# Patient Record
Sex: Male | Born: 1975 | Race: White | Hispanic: No | Marital: Married | Smoking: Never smoker
Health system: Southern US, Community
[De-identification: ages and names within clinical notes are randomized; demographics above are authoritative.]

## PROBLEM LIST (undated history)

## (undated) ENCOUNTER — Ambulatory Visit (HOSPITAL_COMMUNITY): Payer: Self-pay

## (undated) DIAGNOSIS — I4891 Unspecified atrial fibrillation: Secondary | ICD-10-CM

## (undated) DIAGNOSIS — L709 Acne, unspecified: Secondary | ICD-10-CM

## (undated) DIAGNOSIS — J45909 Unspecified asthma, uncomplicated: Secondary | ICD-10-CM

## (undated) HISTORY — DX: Unspecified asthma, uncomplicated: J45.909

## (undated) HISTORY — PX: CARDIOVERSION: SHX1299

## (undated) HISTORY — PX: FOOT SURGERY: SHX648

## (undated) HISTORY — DX: Acne, unspecified: L70.9

---

## 1998-04-11 ENCOUNTER — Emergency Department (HOSPITAL_COMMUNITY): Admission: EM | Admit: 1998-04-11 | Discharge: 1998-04-11 | Payer: Self-pay | Admitting: Emergency Medicine

## 1998-09-23 ENCOUNTER — Emergency Department (HOSPITAL_COMMUNITY): Admission: EM | Admit: 1998-09-23 | Discharge: 1998-09-23 | Payer: Self-pay | Admitting: Emergency Medicine

## 1998-09-27 ENCOUNTER — Emergency Department (HOSPITAL_COMMUNITY): Admission: EM | Admit: 1998-09-27 | Discharge: 1998-09-27 | Payer: Self-pay | Admitting: Emergency Medicine

## 2000-06-26 ENCOUNTER — Emergency Department (HOSPITAL_COMMUNITY): Admission: EM | Admit: 2000-06-26 | Discharge: 2000-06-26 | Payer: Self-pay | Admitting: Emergency Medicine

## 2001-07-14 ENCOUNTER — Encounter: Payer: Self-pay | Admitting: *Deleted

## 2001-07-14 ENCOUNTER — Emergency Department (HOSPITAL_COMMUNITY): Admission: AC | Admit: 2001-07-14 | Discharge: 2001-07-14 | Payer: Self-pay

## 2001-07-15 ENCOUNTER — Emergency Department (HOSPITAL_COMMUNITY): Admission: EM | Admit: 2001-07-15 | Discharge: 2001-07-15 | Payer: Self-pay | Admitting: *Deleted

## 2001-07-17 ENCOUNTER — Emergency Department (HOSPITAL_COMMUNITY): Admission: EM | Admit: 2001-07-17 | Discharge: 2001-07-17 | Payer: Self-pay | Admitting: Emergency Medicine

## 2001-07-17 ENCOUNTER — Encounter: Payer: Self-pay | Admitting: Emergency Medicine

## 2004-04-05 ENCOUNTER — Emergency Department (HOSPITAL_COMMUNITY): Admission: EM | Admit: 2004-04-05 | Discharge: 2004-04-05 | Payer: Self-pay | Admitting: Emergency Medicine

## 2004-11-29 ENCOUNTER — Emergency Department (HOSPITAL_COMMUNITY): Admission: EM | Admit: 2004-11-29 | Discharge: 2004-11-29 | Payer: Self-pay | Admitting: Family Medicine

## 2005-07-27 ENCOUNTER — Emergency Department (HOSPITAL_COMMUNITY): Admission: EM | Admit: 2005-07-27 | Discharge: 2005-07-28 | Payer: Self-pay | Admitting: Emergency Medicine

## 2006-07-13 ENCOUNTER — Encounter: Admission: RE | Admit: 2006-07-13 | Discharge: 2006-07-13 | Payer: Self-pay | Admitting: Sports Medicine

## 2006-07-13 ENCOUNTER — Ambulatory Visit: Payer: Self-pay | Admitting: Family Medicine

## 2006-08-02 ENCOUNTER — Ambulatory Visit: Payer: Self-pay | Admitting: Sports Medicine

## 2006-10-13 ENCOUNTER — Ambulatory Visit: Payer: Self-pay | Admitting: Family Medicine

## 2006-11-10 DIAGNOSIS — L708 Other acne: Secondary | ICD-10-CM | POA: Insufficient documentation

## 2007-04-18 ENCOUNTER — Encounter: Payer: Self-pay | Admitting: Family Medicine

## 2007-04-19 ENCOUNTER — Telehealth (INDEPENDENT_AMBULATORY_CARE_PROVIDER_SITE_OTHER): Payer: Self-pay | Admitting: Family Medicine

## 2007-05-11 ENCOUNTER — Ambulatory Visit: Payer: Self-pay | Admitting: Family Medicine

## 2007-05-11 ENCOUNTER — Telehealth (INDEPENDENT_AMBULATORY_CARE_PROVIDER_SITE_OTHER): Payer: Self-pay | Admitting: *Deleted

## 2007-05-11 DIAGNOSIS — R030 Elevated blood-pressure reading, without diagnosis of hypertension: Secondary | ICD-10-CM | POA: Insufficient documentation

## 2007-05-11 DIAGNOSIS — M5440 Lumbago with sciatica, unspecified side: Secondary | ICD-10-CM | POA: Insufficient documentation

## 2007-05-17 ENCOUNTER — Encounter (INDEPENDENT_AMBULATORY_CARE_PROVIDER_SITE_OTHER): Payer: Self-pay | Admitting: Family Medicine

## 2007-05-17 ENCOUNTER — Ambulatory Visit: Payer: Self-pay | Admitting: Family Medicine

## 2007-12-18 ENCOUNTER — Telehealth: Payer: Self-pay | Admitting: *Deleted

## 2010-07-29 ENCOUNTER — Encounter: Payer: Self-pay | Admitting: Family Medicine

## 2010-10-13 NOTE — Miscellaneous (Signed)
   Clinical Lists Changes  Problems: Removed problem of ASTHMA, UNSPECIFIED (ICD-493.90) 

## 2010-11-01 ENCOUNTER — Encounter: Payer: Self-pay | Admitting: *Deleted
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
## 2012-05-14 ENCOUNTER — Emergency Department (HOSPITAL_COMMUNITY)
Admission: EM | Admit: 2012-05-14 | Discharge: 2012-05-14 | Disposition: A | Discharge status: Home / Self Care | Payer: BC Managed Care – PPO | Attending: Emergency Medicine | Admitting: Emergency Medicine

## 2012-05-14 ENCOUNTER — Encounter (HOSPITAL_COMMUNITY): Payer: Self-pay | Admitting: Emergency Medicine

## 2012-05-14 DIAGNOSIS — M545 Low back pain, unspecified: Secondary | ICD-10-CM | POA: Insufficient documentation

## 2012-05-14 LAB — URINE MICROSCOPIC-ADD ON

## 2012-05-14 LAB — CBC WITH DIFFERENTIAL/PLATELET
Basophils Absolute: 0 10*3/uL (ref 0.0–0.1)
Basophils Relative: 1 % (ref 0–1)
Eosinophils Absolute: 0.1 10*3/uL (ref 0.0–0.7)
Eosinophils Relative: 2 % (ref 0–5)
HCT: 44.1 % (ref 39.0–52.0)
Hemoglobin: 15.6 g/dL (ref 13.0–17.0)
Lymphocytes Relative: 33 % (ref 12–46)
MCHC: 35.4 g/dL (ref 30.0–36.0)
Monocytes Relative: 7 % (ref 3–12)
Neutrophils Relative %: 58 % (ref 43–77)
WBC: 6.1 10*3/uL (ref 4.0–10.5)

## 2012-05-14 LAB — URINALYSIS, ROUTINE W REFLEX MICROSCOPIC
Ketones, ur: NEGATIVE mg/dL
Leukocytes, UA: NEGATIVE
Nitrite: NEGATIVE
Specific Gravity, Urine: 1.011 (ref 1.005–1.030)

## 2012-05-14 LAB — BASIC METABOLIC PANEL
CO2: 31 mEq/L (ref 19–32)
Calcium: 9.9 mg/dL (ref 8.4–10.5)
GFR calc Af Amer: >90 mL/min (ref >90)
Potassium: 4.6 mEq/L (ref 3.5–5.1)

## 2012-05-14 MED ORDER — OXYCODONE-ACETAMINOPHEN 5-325 MG PO TABS: ORAL_TABLET | ORAL | Status: AC

## 2012-05-14 NOTE — ED Notes (Signed)
Pt presents w/ low back pain x 9 days. Denies injury of any kind. Denies nausea, dysuria, states urine does have an odor at time.s

## 2012-05-14 NOTE — ED Provider Notes (Signed)
History     CSN: [id number]  Arrival date & time 05/14/12  4098   First MD Initiated Contact with Patient 05/14/12 1029      Chief Complaint  Patient presents with  . Back Pain    (Consider location/radiation/quality/duration/timing/severity/associated sxs/prior treatment) HPI  36 y.o. male iINAD c/o pressure like, 9/10 central lumbar sacral back pain, x9 days. Pian level has been constant, mild relief with ibuprofen, exacerbated by movement and weight bearing. Denies any trauma or prior episodes of back pain. Pt does lifting and construction for work x7 days. and Denies fever, N/V, change in bowel habits, h/o CA or IVDU. Pt reports urine has been dark and foul smelling x2 days.   History reviewed. No pertinent past medical history.  History reviewed. No pertinent past surgical history.  No family history on file.  History  Substance Use Topics  . Smoking status: Never Smoker   . Smokeless tobacco: Never Used  . Alcohol Use: No      Review of Systems  Constitutional: Negative for fever.  HENT: Negative for neck pain.   Respiratory: Negative for shortness of breath.   Cardiovascular: Negative for chest pain.  Gastrointestinal: Negative for nausea, vomiting, abdominal pain and diarrhea.  Genitourinary: Negative for dysuria and difficulty urinating.  Musculoskeletal: Positive for back pain.  Neurological: Negative for weakness and numbness.  All other systems reviewed and are negative.    Allergies  Review of patient's allergies indicates no known allergies.  Home Medications   Current Outpatient Rx  Name Route Sig Dispense Refill  . IBUPROFEN 200 MG PO TABS Oral Take 400 mg by mouth every 8 (eight) hours as needed. For pain.      BP 122/79  Pulse 93  Temp 98.3 F (36.8 C) (Oral)  SpO2 100%  Physical Exam  Nursing note and vitals reviewed. Constitutional: He is oriented to person, place, and time. He appears well-developed and well-nourished. No distress.    HENT:  Head: Normocephalic.  Eyes: Conjunctivae and EOM are normal. Pupils are equal, round, and reactive to light.  Neck: Normal range of motion.  Cardiovascular: Normal rate and regular rhythm.   Pulmonary/Chest: Effort normal.  Abdominal: Soft. Bowel sounds are normal.  Musculoskeletal: Normal range of motion.       Decreased range of motion in flexion and lateral extension. No CVA tenderness bilaterally.  Neurological: He is alert and oriented to person, place, and time.       Cranial nerves III through XII intact, strength 5 out of 5x4 extremities, negative pronator drift, finger to nose and heel-to-shin coordinated, sensation intact to pinprick and light touch, gait is coordinated and Romberg is negative.   Psychiatric: He has a normal mood and affect.    ED Course  Procedures (including critical care time)  Labs Reviewed  URINALYSIS, ROUTINE W REFLEX MICROSCOPIC - Abnormal; Notable for the following:    Hgb urine dipstick TRACE (*)     All other components within normal limits  BASIC METABOLIC PANEL - Abnormal; Notable for the following:    Glucose, Bld 102 (*)     GFR calc non Af Amer 84 (*)     All other components within normal limits  CBC WITH DIFFERENTIAL  URINE MICROSCOPIC-ADD ON   No results found.   1. Lumbago       MDM  Low back pain with no red flags. Over for her to orthopedist control pain with prescription for Percocet. He has a trace amount of blood  in his urine I will refer him to a primary care for recheck of urinalysis in several months.  New Prescriptions   OXYCODONE-ACETAMINOPHEN (PERCOCET/ROXICET) 5-325 MG PER TABLET    1 to 2 tabs PO q6hrs  PRN for pain          Wynetta Emery, PA-C 05/14/12 1236

## 2012-05-19 NOTE — ED Provider Notes (Signed)
Medical screening examination/treatment/procedure(s) were performed by non-physician practitioner and as supervising physician I was immediately available for consultation/collaboration.   Loren Racer, MD 05/19/12 [phone]

## 2012-10-19 ENCOUNTER — Ambulatory Visit (INDEPENDENT_AMBULATORY_CARE_PROVIDER_SITE_OTHER): Payer: Self-pay | Admitting: Family Medicine

## 2012-10-19 ENCOUNTER — Encounter: Payer: Self-pay | Admitting: Family Medicine

## 2012-10-19 VITALS — BP 151/89 | HR 83 | Ht 74.0 in | Wt 191.0 lb

## 2012-10-19 DIAGNOSIS — D229 Melanocytic nevi, unspecified: Secondary | ICD-10-CM

## 2012-10-19 DIAGNOSIS — L918 Other hypertrophic disorders of the skin: Secondary | ICD-10-CM | POA: Insufficient documentation

## 2012-10-19 DIAGNOSIS — D239 Other benign neoplasm of skin, unspecified: Secondary | ICD-10-CM

## 2012-10-19 DIAGNOSIS — R030 Elevated blood-pressure reading, without diagnosis of hypertension: Secondary | ICD-10-CM

## 2012-10-19 DIAGNOSIS — L708 Other acne: Secondary | ICD-10-CM

## 2012-10-19 MED ORDER — TETRACYCLINE HCL 250 MG PO CAPS: 250.0000 mg | ORAL_CAPSULE | Freq: Two times a day (BID) | ORAL | Status: DC

## 2012-10-19 NOTE — Progress Notes (Signed)
Subjective:     Patient ID: Juan Rogers, male   DOB: 1976/03/21, 37 y.o.   MRN: [id number]  HPI Juan Rogers is a 37 year old male who presents to the clinic today to establish care.  Patient would like to discuss acne and mole removal.  1) Acne - Patient has long standing history of acne (since he was 37 years old).  Patient has had significant scarring as a result. - Patient has been on Tetracycline before with good results and prevention of breakouts and subsequent scarring. - Reports occasional outbreaks primarily on his face and back.  2) Moles - Patient reports bothersome moles on the back.  Patient also reports mole on his belt/waistline. - He reports that the moles on his back itch and burn, and the mole on his waistline constantly catches on his clothing.  They are very bothersome and irritating. - He and his spouse deny any change in shape, or size.  Some subjective color change noted by spouse.  Review of Systems Denies any recent illness, fevers, chills.  No other complaints today.    Objective:   Physical Exam General: well appearing gentleman in NAD. Heart: RRR. No murmurs, rubs, or gallops. Lungs: CTAB. No rales, rhonchi, or wheeze. Skin: marked facial scarring noted.  3 flesh colored, pedunculated nevi noted on the back (L scapular, mid thoracic and R scapular regions).  Well circumscribed, symmetrical, with distinct borders.  No appreciable color changes noted.   1 nevi noted at the waist line.  Appears slightly darker than flesh.  Symmetrical, distinct borders.    Assessment:         Plan:

## 2012-10-19 NOTE — Assessment & Plan Note (Signed)
Patient instructed to follow up in 1-2 weeks for beginning of mole removal.

## 2012-10-19 NOTE — Assessment & Plan Note (Signed)
Discussed topical vs systemic therapy and side effects of medications. Patient would like to resume Tetracycline.  Rx sent for Tetracycline 250 mg BID.

## 2012-10-19 NOTE — Patient Instructions (Addendum)
It was nice meeting you today.  I have sent in your prescription for Tetracycline.  Please return in 2 weeks for mole removal.

## 2012-10-19 NOTE — Assessment & Plan Note (Signed)
Patient likely has HTN.  Will recheck BP next visit.  If >140/90 will start treatment.

## 2012-11-06 ENCOUNTER — Ambulatory Visit (INDEPENDENT_AMBULATORY_CARE_PROVIDER_SITE_OTHER): Payer: Self-pay | Admitting: Family Medicine

## 2012-11-06 VITALS — BP 124/84 | HR 76 | Temp 99.2°F | Ht 74.0 in | Wt 189.0 lb

## 2012-11-06 DIAGNOSIS — L919 Hypertrophic disorder of the skin, unspecified: Secondary | ICD-10-CM

## 2012-11-06 DIAGNOSIS — L918 Other hypertrophic disorders of the skin: Secondary | ICD-10-CM

## 2012-11-06 DIAGNOSIS — L909 Atrophic disorder of skin, unspecified: Secondary | ICD-10-CM

## 2012-11-06 NOTE — Assessment & Plan Note (Signed)
4 skin tags removed.  Aftercare instructions verbally given.

## 2012-11-06 NOTE — Progress Notes (Signed)
Subjective:     Patient ID: Juan Rogers, male   DOB: 06-02-1976, 37 y.o.   MRN: [id number]  HPI 37 year old gentleman presents today for mole/skin tag removal.  1) Skin tags - Patient reports 4 skin lesions that he wants removed.  He reports that the  "moles" on his back itch and burn, and the "mole" on his waistline constantly catches on his clothing.  - They are very bothersome and irritating.  They are irritated by clothing and physical contact.  Review of Systems See HPI    Objective:   Physical Exam General: well appearing. NAD. Skin: 4 skin tags noted.  3 posterior on the back (located the the thoracic region and the R & L scapular regons).  1 located centrally below the umbilicus at the area of the waistline.    Assessment:     1) Skin tags     Plan:     Skin tags and surrounding areas were cleansed with alcohol. Topical cold analgesic spray was used for the skin tags on the back.  Local anesthesia with 2% lidocaine (1 mL) was used in addition to analgesic spray for the skin tag on the waistline. Skin tags were subsequently removed using a curved hemostat and sterile scissors.  Silver nitrate stick was used to cauterize the area after removing waistline skin tag.  The remaining skin tags did not require cauterization.  Bandaids were placed following excision.  Blood loss - minimal.  Patient tolerated procedure well.  These pathognomonic lesions are not sent for pathology.

## 2012-11-09 ENCOUNTER — Encounter: Payer: Self-pay | Admitting: Family Medicine

## 2012-11-09 ENCOUNTER — Ambulatory Visit (INDEPENDENT_AMBULATORY_CARE_PROVIDER_SITE_OTHER): Payer: Self-pay | Admitting: Family Medicine

## 2012-11-09 ENCOUNTER — Telehealth: Payer: Self-pay | Admitting: Family Medicine

## 2012-11-09 VITALS — BP 129/85 | HR 73 | Ht 74.0 in | Wt 189.0 lb

## 2012-11-09 DIAGNOSIS — L089 Local infection of the skin and subcutaneous tissue, unspecified: Secondary | ICD-10-CM

## 2012-11-09 MED ORDER — CEPHALEXIN 500 MG PO CAPS: 500.0000 mg | ORAL_CAPSULE | Freq: Three times a day (TID) | ORAL | Status: AC

## 2012-11-09 NOTE — Patient Instructions (Addendum)
Thank you for coming in today, it was good to see you The area around your waist is infected Complete the course of keflex.  Return for worsening symptoms.

## 2012-11-09 NOTE — Telephone Encounter (Signed)
Had moles removed on Monday and he thinks that they are infected today - wants to know if he should come in.  Dr Adriana Simas has avail appt tomorrow, but he wants to talk to nurse first

## 2012-11-09 NOTE — Telephone Encounter (Signed)
Advised patient to come to office now for work in appointment. Patient states area on waist in front is red quarter size and has drainage.

## 2012-11-13 DIAGNOSIS — L089 Local infection of the skin and subcutaneous tissue, unspecified: Secondary | ICD-10-CM | POA: Insufficient documentation

## 2012-11-13 NOTE — Assessment & Plan Note (Signed)
Small amount of purulent draining and erythema.  Will start 10 day course of keflex.  Instructed to return for worsening symptoms.  Irritation from waist band of pants likely irritating as well, advised keeping covered.

## 2012-11-13 NOTE — Progress Notes (Signed)
  Subjective:    Patient ID: Juan Rogers, Juan Rogers    DOB: 1976-03-19, 37 y.o.   MRN: [id number]  HPI  1. ? Skin infection:  C/o drainage for site where skin tag was removed on 2/24.  Has noticed pain, redness and yellow/white drainage from area along waist line where skin tag was removed.  Was given rx for tetracycline for acne earlier in month as well but has not started.  He denies fever,chills, pain or drainage from other sites of removal.   Review of Systems Per HPI    Objective:   Physical Exam  Constitutional: He appears well-nourished. No distress.  Skin:  Small erythematous area along anterior waist line from previous removal of skin tag.  There is purulent drainage.  No induration or fluctuance of area.            Assessment & Plan:

## 2013-03-21 ENCOUNTER — Other Ambulatory Visit: Payer: Self-pay | Admitting: Family Medicine

## 2013-07-20 ENCOUNTER — Ambulatory Visit: Payer: Self-pay | Admitting: Family Medicine

## 2013-11-19 ENCOUNTER — Other Ambulatory Visit: Payer: Self-pay | Admitting: *Deleted

## 2013-11-19 ENCOUNTER — Other Ambulatory Visit: Payer: Self-pay | Admitting: Family Medicine

## 2013-11-19 MED ORDER — TETRACYCLINE HCL 250 MG PO CAPS: ORAL_CAPSULE | ORAL | Status: DC

## 2013-11-21 ENCOUNTER — Other Ambulatory Visit: Payer: Self-pay | Admitting: Family Medicine

## 2013-11-21 MED ORDER — DOXYCYCLINE HYCLATE 150 MG PO TBEC: 150.0000 mg | DELAYED_RELEASE_TABLET | Freq: Every day | ORAL | Status: DC

## 2014-03-05 ENCOUNTER — Other Ambulatory Visit: Payer: Self-pay | Admitting: *Deleted

## 2014-03-06 MED ORDER — TETRACYCLINE HCL 250 MG PO CAPS: ORAL_CAPSULE | ORAL | Status: DC

## 2014-07-16 ENCOUNTER — Other Ambulatory Visit: Payer: Self-pay | Admitting: Family Medicine

## 2014-07-30 ENCOUNTER — Ambulatory Visit: Payer: Self-pay | Admitting: Family Medicine

## 2014-09-20 ENCOUNTER — Other Ambulatory Visit (INDEPENDENT_AMBULATORY_CARE_PROVIDER_SITE_OTHER): Payer: Self-pay | Admitting: Surgery

## 2014-09-20 NOTE — H&P (Signed)
Juan [facility] 09/20/2014 3:57 PM Location: [facility] Patient #: [id number] DOB: Aug 17, 1976 Married / Language: Juan [facility] / Race: White Male [place] All Reviewed History of Present Illness [name] MD; 09/20/2014 4:33 PM) Patient words: anal fistula.  The patient is a 39 year old male who presents with anal pain. Young male with 6 months of rectal pain and bleeding when he wipes for the past 6 months. Persistent. Wished to be seen. He normally has 1 or 2 bowel movements a day. 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 Does not recall a specific event that set things off. Occasionally the bowel movements can have sharp pain. Occasionally a few drops of blood. Usually only when he wipes. At one point he thought he might of felt a lump but not certain. No history of any gastrointestinal problems. No ulcerative colitis, Crohn's, irritable bowel syndrome. No family history of any such problems. No history of colon polyps or cancers in his family. He can walk a half hour easily without difficulty. No fevers chills or sweats. No purulent drainage. I saw him November 2015. Diagnosised posterior anal fissure. Recommended diltiazem cream. Came back 3 weeks later. Some improvement. I renewed the diltiazem cream. He had been using it 3 times a day. He thinks it helps a little. He felt like he wasn't helping much. He is gone back to using hydrocortisone cream. He is trying to void severe constipation or diarrhea. He is very concerned about needing to be off work to have surgery. Struggles with rectal pain. Still afraid to have BMs. Mild bleeding. Other Problems [name], MD; 09/20/2014 4:15 PM) ANAL FISSURE (565.0  Y70.6) Asthma Umbilical Hernia Repair  Diagnostic Studies History [name], MD; 09/20/2014 4:15 PM) Colonoscopy never  Allergies [name], MD; 09/20/2014 4:15 PM) No Known Drug Allergies 08/13/2014  Medication History [name],  MD; 09/20/2014 4:15 PM) DILTIAZEM Gel (2% Gel, see note application External three times daily, Taken starting 08/13/2014) Active. Tetracycline HCl (250MG  Capsule, Oral) Active.  Social History [name], MD; 09/20/2014 4:15 PM) Caffeine use Tea. No alcohol use No drug use Tobacco use Never smoker.  Family History [name], MD; 09/20/2014 4:15 PM) Cancer Family Members In General. Cervical Cancer Sister. Diabetes Mellitus Family Members In General. Heart Disease Family Members In General. Hypertension Family Members In General. Migraine Headache Brother, Mother.     Review of Systems [name] MD; 09/20/2014 4:15 PM) [place] as Reviewed General Not Present- Appetite Loss, Chills, Fatigue, Fever, Night Sweats, Weight Gain and Weight Loss. Skin Not Present- Change in Wart/Mole, Dryness, Hives, Jaundice, New Lesions, Non-Healing Wounds, Rash and Ulcer. HEENT Present- Seasonal Allergies. Not Present- Earache, Hearing Loss, Hoarseness, Nose Bleed, Oral Ulcers, Ringing in the Ears, Sinus Pain, Sore Throat, Visual Disturbances, Wears glasses/contact lenses and Yellow Eyes. Respiratory Not Present- Bloody sputum, Chronic Cough, Difficulty Breathing, Snoring and Wheezing. Breast Not Present- Breast Mass, Breast Pain, Nipple Discharge and Skin Changes. Cardiovascular Not Present- Chest Pain, Difficulty Breathing Lying Down, Leg Cramps, Palpitations, Rapid Heart Rate, Shortness of Breath and Swelling of Extremities. Gastrointestinal Present- Bloody Stool and Rectal Pain. Not Present- Abdominal Pain, Bloating, Change in Bowel Habits, Chronic diarrhea, Constipation, Difficulty Swallowing, Excessive gas, Gets full quickly at meals, Hemorrhoids, Indigestion, Nausea and Vomiting. Male Genitourinary Not Present- Blood in Urine, Change in Urinary Stream, Frequency, Impotence, Nocturia, Painful Urination, Urgency and Urine Leakage. Musculoskeletal Not Present- Back Pain, Joint Pain,  Joint Stiffness, Muscle Pain,  Muscle Weakness and Swelling of Extremities. Neurological Not Present- Decreased Memory, Fainting, Headaches, Numbness, Seizures, Tingling, Tremor, Trouble walking and Weakness. Psychiatric Not Present- Anxiety, Bipolar, Change in Sleep Pattern, Depression, Fearful and Frequent crying. Endocrine Not Present- Cold Intolerance, Excessive Hunger, Hair Changes, Heat Intolerance and New Diabetes. Hematology Not Present- Easy Bruising, Excessive bleeding, Gland problems, HIV and Persistent Infections.  Vitals [name] CMA; 09/20/2014 3:58 PM) 09/20/2014 3:58 PM Weight: 193.25 lb Height: 73in Body Surface Area: 2.12 m Body Mass Index: 25.5 kg/m Temp.: 97.27F  Pulse: 86 (Regular)  BP: 110/72 (Sitting, Left Arm, Standard)     Physical Exam [name] MD; 09/20/2014 4:33 PM)  General Mental Status-Alert. General Appearance-Not in acute distress, Not Sickly. Orientation-Oriented X3. Hydration-Well hydrated. Voice-Normal.  Integumentary Global Assessment Normal Exam - Axillae: non-tender, no inflammation or ulceration, no drainage. and Distribution of scalp and body hair is normal. General Characteristics Temperature - normal warmth is noted.  Head and Neck Head-normocephalic, atraumatic with no lesions or palpable masses. Face Global Assessment - atraumatic, no absence of expression. Neck Global Assessment - no abnormal movements, no bruit auscultated on the right, no bruit auscultated on the left, no decreased range of motion, non-tender. Trachea-midline. Thyroid Gland Characteristics - non-tender.  Eye Eyeball - Left-Extraocular movements intact, No Nystagmus. Eyeball - Right-Extraocular movements intact, No Nystagmus. Cornea - Left-No Hazy. Cornea - Right-No Hazy. Sclera/Conjunctiva - Left-No scleral icterus, No Discharge. Sclera/Conjunctiva - Right-No scleral icterus, No Discharge. Pupil -  Left-Direct reaction to light normal. Pupil - Right-Direct reaction to light normal.  ENMT Ears Pinna - Left - no drainage observed, no generalized tenderness observed. Right - no drainage observed, no generalized tenderness observed. Nose and Sinuses Nose - no destructive lesion observed. Nares - Left - quiet respiration. Right - quiet respiration. Mouth and Throat Lips - Upper Lip - no fissures observed, no pallor noted. Lower Lip - no fissures observed, no pallor noted. Nasopharynx - no discharge present. Oral Cavity/Oropharynx - Tongue - no dryness observed. Oral Mucosa - no cyanosis observed. Hypopharynx - no evidence of airway distress observed.  Chest and Lung Exam Inspection Movements - Normal and Symmetrical. Accessory muscles - No use of accessory muscles in breathing. Palpation Normal exam - Non-tender. Auscultation Breath sounds - Normal and Clear.  Cardiovascular Auscultation Rhythm - Regular. Murmurs & Other Heart Sounds - Normal exam - No Murmurs and No Systolic Clicks.  Abdomen Inspection Normal Exam - No Visible peristalsis and No Abnormal pulsations. Umbilicus - No Bleeding, No Urine drainage. Palpation/Percussion Normal exam - Soft, Non Tender, No Rebound tenderness, No Rigidity (guarding) and No Cutaneous hyperesthesia.  Male Genitourinary Sexual Maturity Tanner 5 - Adult hair pattern and Adult penile size and shape.  Rectal Anorectal Exam External - localized tenderness and anal fissure, no anorectal fistula, no external hemorrhoids, no lacerations. Note: Persistent posterior midline anal fissure. Not closed. Small sentiel tag. Increased sphincter tone.   Peripheral Vascular Upper Extremity Inspection - Left - No Cyanotic nailbeds, Not Ischemic. Right - No Cyanotic nailbeds, Not Ischemic.  Neurologic Neurologic evaluation reveals -normal attention span and ability to concentrate, able to name objects and repeat phrases. Appropriate fund of  knowledge , normal sensation and normal coordination. Mental Status Affect - not angry, not paranoid. Cranial Nerves-Normal Bilaterally. Gait-Normal.  Neuropsychiatric Mental status exam performed with findings of-able to articulate well with normal speech/language, rate, volume and coherence, thought content normal with ability to perform basic computations and apply abstract reasoning and no evidence of  hallucinations, delusions, obsessions or homicidal/suicidal ideation.  Musculoskeletal Global Assessment Spine, Ribs and Pelvis - no instability, subluxation or laxity. Right Upper Extremity - no instability, subluxation or laxity.  Lymphatic Head & Neck General Head & Neck Lymphatics: Bilateral - Description - No Localized lymphadenopathy. Axillary General Axillary Region: Bilateral - Description - No Localized lymphadenopathy. Femoral & Inguinal Generalized Femoral & Inguinal Lymphatics: Left: Right - Description - No Localized lymphadenopathy. Description - No Localized lymphadenopathy.    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 He has failed muscle relaxant therapy with diltiazem > a month. He no longer is using it. Plan EUA / lateral internal sphincterotomy:  The anatomy & physiology of the anorectal region was discussed. The pathophysiology of anal fissure and differential diagnosis was discussed. Natural history progression was discussed. I stressed the importance of a bowel regimen to have daily soft bowel movements to minimize progression of disease.  The patient's condition is not adequately controlled. Non-operative treatment has not healed the fissure. Therefore, I recommended examination under anesthesia for better examination to confirm the diagnosis and treat by lateral internal sphincterotomy to relax the spasm better & allow the fissure to heal. Technique,  benefits, alternatives were discussed. I noted a good likelihood this will help address the problem. Risks such as bleeding, pain, incontinence, recurrence, heart attack, death, and other risks were discussed.  Educational handouts further explaining the pathology, treatment options, and bowel regimen were given as well. The patient expressed understanding & wishes to proceed with surgery.  Current Plans Pt Education - [facility] Anal Fissure (Clyde Zarrella) Pt Education - [facility] Good Bowel Health (Sheilyn Boehlke) Pt Education - [facility] Rectal Surgery HCI (Alecxander Mainwaring): discussed with patient and provided information. Schedule for Surgery

## 2014-12-10 ENCOUNTER — Other Ambulatory Visit: Payer: Self-pay | Admitting: Family Medicine

## 2014-12-10 ENCOUNTER — Telehealth: Payer: Self-pay | Admitting: Family Medicine

## 2014-12-10 MED ORDER — TETRACYCLINE HCL 250 MG PO CAPS: 250.0000 mg | ORAL_CAPSULE | Freq: Two times a day (BID) | ORAL | Status: DC

## 2014-12-10 NOTE — Telephone Encounter (Signed)
I am covering Dr.Cook's inbox this week, received a request for abx for acne from this patient. He has not been seen at our clinic in 2 years and will need to make an appointment with his PCP for future refills. I have refilled his antibiotic for 1 month. Please make pt aware. Thanks.

## 2014-12-11 NOTE — Telephone Encounter (Signed)
LM for patient to call back and make an appt with his pcp. Jazmin Hartsell,CMA

## 2015-01-03 ENCOUNTER — Ambulatory Visit (INDEPENDENT_AMBULATORY_CARE_PROVIDER_SITE_OTHER): Payer: BLUE CROSS/BLUE SHIELD | Admitting: Family Medicine

## 2015-01-03 ENCOUNTER — Encounter: Payer: Self-pay | Admitting: Family Medicine

## 2015-01-03 VITALS — BP 146/73 | HR 70 | Temp 98.1°F | Ht 74.0 in | Wt 202.1 lb

## 2015-01-03 DIAGNOSIS — R030 Elevated blood-pressure reading, without diagnosis of hypertension: Secondary | ICD-10-CM

## 2015-01-03 DIAGNOSIS — L708 Other acne: Secondary | ICD-10-CM

## 2015-01-03 DIAGNOSIS — Z792 Long term (current) use of antibiotics: Secondary | ICD-10-CM | POA: Diagnosis not present

## 2015-01-03 LAB — CBC WITH DIFFERENTIAL/PLATELET
Basophils Absolute: 0.1 10*3/uL (ref 0.0–0.1)
Basophils Relative: 1 % (ref 0–1)
EOS ABS: 0.2 10*3/uL (ref 0.0–0.7)
EOS PCT: 3 % (ref 0–5)
HEMATOCRIT: 38.8 % — AB (ref 39.0–52.0)
HEMOGLOBIN: 14 g/dL (ref 13.0–17.0)
LYMPHS PCT: 40 % (ref 12–46)
Lymphs Abs: 2.7 10*3/uL (ref 0.7–4.0)
MCH: 31.5 pg (ref 26.0–34.0)
MCHC: 36.1 g/dL — AB (ref 30.0–36.0)
MCV: 87.4 fL (ref 78.0–100.0)
MONO ABS: 0.5 10*3/uL (ref 0.1–1.0)
MONOS PCT: 8 % (ref 3–12)
MPV: 9.9 fL (ref 8.6–12.4)
Neutro Abs: 3.3 10*3/uL (ref 1.7–7.7)
Neutrophils Relative %: 48 % (ref 43–77)
Platelets: 329 10*3/uL (ref 150–400)
RBC: 4.44 MIL/uL (ref 4.22–5.81)
RDW: 13.5 % (ref 11.5–15.5)
WBC: 6.8 10*3/uL (ref 4.0–10.5)

## 2015-01-03 NOTE — Patient Instructions (Signed)
It was nice to see you today.  Follow up annually.  I will send a letter with your lab results.  Take care  Dr. Lacinda Axon

## 2015-01-04 ENCOUNTER — Encounter: Payer: Self-pay | Admitting: Family Medicine

## 2015-01-04 LAB — COMPLETE METABOLIC PANEL WITH GFR
ALBUMIN: 4.1 g/dL (ref 3.5–5.2)
ALT: 17 U/L (ref 0–53)
AST: 20 U/L (ref 0–37)
Alkaline Phosphatase: 59 U/L (ref 39–117)
BUN: 17 mg/dL (ref 6–23)
CALCIUM: 9.6 mg/dL (ref 8.4–10.5)
CHLORIDE: 103 meq/L (ref 96–112)
CO2: 27 mEq/L (ref 19–32)
Creat: 1.22 mg/dL (ref 0.50–1.35)
GFR, Est African American: 88 mL/min
GFR, Est Non African American: 76 mL/min
GLUCOSE: 89 mg/dL (ref 70–99)
POTASSIUM: 4.2 meq/L (ref 3.5–5.3)
Sodium: 138 mEq/L (ref 135–145)
Total Bilirubin: 0.4 mg/dL (ref 0.2–1.2)
Total Protein: 6.7 g/dL (ref 6.0–8.3)

## 2015-01-04 NOTE — Assessment & Plan Note (Signed)
BP elevated today. Will re-evaluate at next visit. If elevated, will discuss treatment vs ambulatory BP monitoring.

## 2015-01-04 NOTE — Assessment & Plan Note (Signed)
Doing well.  Will refill tetracycline. CMP today to check LFT's.

## 2015-01-04 NOTE — Progress Notes (Signed)
   Subjective:    Patient ID: Juan [facility], male    DOB: 1976-02-22, 39 y.o.   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  1) Acne  Patient has a longstanding history of acne.  He has significant scarring.  He is currently taking Tetracycline for this and is doing quite well.  He has occasional outbreaks, but overall his acne is very well controlled.  No reports of photosensitivity, GI upset.  He has no other complaints today.  Review of Systems Per HPI    Objective:   Physical Exam Filed Vitals:   01/03/15 1609  BP: 146/73  Pulse: 70  Temp: 98.1 F (36.7 C)   Vital signs reviewed.  Exam: General: well appearing, NAD. Cardiovascular: RRR. No murmurs, rubs, or gallops. Respiratory: CTAB. No rales, rhonchi, or wheeze. Abdomen: soft, nontender, nondistended. No organomegaly. Skin: Warm, dry, intact.    Assessment & Plan:  See Problem List

## 2015-01-13 ENCOUNTER — Other Ambulatory Visit: Payer: Self-pay | Admitting: Family Medicine

## 2015-02-04 ENCOUNTER — Other Ambulatory Visit: Payer: Self-pay | Admitting: Family Medicine

## 2015-02-05 ENCOUNTER — Other Ambulatory Visit: Payer: Self-pay | Admitting: Family Medicine

## 2015-02-05 NOTE — Telephone Encounter (Signed)
Mr. Juan Rogers is calling because the refill for his tetracycline was received by the pharmacy, however, they were unable to retrieve it (possibly a "glitch"), they would like for it to be resent so that it may be refilled for the patient. Please contact Mr. Juan Rogers once this has been completed for him. Thank you, Fonda Kinder, ASA

## 2015-02-05 NOTE — Telephone Encounter (Signed)
Left voice message for informing him that Rx was called in to [facility].  Derl Barrow, RN

## 2015-06-17 ENCOUNTER — Other Ambulatory Visit: Payer: Self-pay | Admitting: *Deleted

## 2015-06-17 MED ORDER — TETRACYCLINE HCL 250 MG PO CAPS: 250.0000 mg | ORAL_CAPSULE | Freq: Two times a day (BID) | ORAL | Status: DC

## 2015-10-29 ENCOUNTER — Other Ambulatory Visit: Payer: Self-pay | Admitting: Family Medicine

## 2015-11-10 ENCOUNTER — Emergency Department (HOSPITAL_COMMUNITY)
Admission: EM | Admit: 2015-11-10 | Discharge: 2015-11-11 | Disposition: A | Discharge status: Home / Self Care | Payer: Managed Care, Other (non HMO) | Attending: Emergency Medicine | Admitting: Emergency Medicine

## 2015-11-10 ENCOUNTER — Encounter (HOSPITAL_COMMUNITY): Payer: Self-pay | Admitting: Emergency Medicine

## 2015-11-10 DIAGNOSIS — Z872 Personal history of diseases of the skin and subcutaneous tissue: Secondary | ICD-10-CM | POA: Diagnosis not present

## 2015-11-10 DIAGNOSIS — Z79899 Other long term (current) drug therapy: Secondary | ICD-10-CM | POA: Diagnosis not present

## 2015-11-10 DIAGNOSIS — R1011 Right upper quadrant pain: Secondary | ICD-10-CM | POA: Insufficient documentation

## 2015-11-10 DIAGNOSIS — J45909 Unspecified asthma, uncomplicated: Secondary | ICD-10-CM | POA: Insufficient documentation

## 2015-11-10 DIAGNOSIS — R197 Diarrhea, unspecified: Secondary | ICD-10-CM | POA: Insufficient documentation

## 2015-11-10 DIAGNOSIS — R3915 Urgency of urination: Secondary | ICD-10-CM | POA: Insufficient documentation

## 2015-11-10 DIAGNOSIS — R350 Frequency of micturition: Secondary | ICD-10-CM | POA: Insufficient documentation

## 2015-11-10 DIAGNOSIS — R109 Unspecified abdominal pain: Secondary | ICD-10-CM

## 2015-11-10 LAB — URINALYSIS, ROUTINE W REFLEX MICROSCOPIC
Bilirubin Urine: NEGATIVE
GLUCOSE, UA: NEGATIVE mg/dL
Ketones, ur: NEGATIVE mg/dL
Leukocytes, UA: NEGATIVE
Nitrite: NEGATIVE
PH: 5.5 (ref 5.0–8.0)
Protein, ur: NEGATIVE mg/dL
SPECIFIC GRAVITY, URINE: 1.02 (ref 1.005–1.030)

## 2015-11-10 LAB — COMPREHENSIVE METABOLIC PANEL
ALBUMIN: 4 g/dL (ref 3.5–5.0)
ALK PHOS: 51 U/L (ref 38–126)
ALT: 22 U/L (ref 17–63)
ANION GAP: 13 (ref 5–15)
AST: 23 U/L (ref 15–41)
BILIRUBIN TOTAL: 0.3 mg/dL (ref 0.3–1.2)
BUN: 12 mg/dL (ref 6–20)
CHLORIDE: 105 mmol/L (ref 101–111)
CO2: 23 mmol/L (ref 22–32)
Calcium: 9.5 mg/dL (ref 8.9–10.3)
Creatinine, Ser: 1.18 mg/dL (ref 0.61–1.24)
GFR calc Af Amer: >60 mL/min (ref >60)
GFR calc non Af Amer: >60 mL/min (ref >60)
Glucose, Bld: 127 mg/dL — ABNORMAL HIGH (ref 65–99)
POTASSIUM: 4.2 mmol/L (ref 3.5–5.1)
SODIUM: 141 mmol/L (ref 135–145)
Total Protein: 6.7 g/dL (ref 6.5–8.1)

## 2015-11-10 LAB — CBC
HEMATOCRIT: 40.8 % (ref 39.0–52.0)
HEMOGLOBIN: 14 g/dL (ref 13.0–17.0)
MCH: 30.7 pg (ref 26.0–34.0)
MCHC: 34.3 g/dL (ref 30.0–36.0)
MCV: 89.5 fL (ref 78.0–100.0)
Platelets: 337 10*3/uL (ref 150–400)
RBC: 4.56 MIL/uL (ref 4.22–5.81)
RDW: 12.4 % (ref 11.5–15.5)
WBC: 8.7 10*3/uL (ref 4.0–10.5)

## 2015-11-10 LAB — URINE MICROSCOPIC-ADD ON

## 2015-11-10 LAB — LIPASE, BLOOD: Lipase: 51 U/L (ref 11–51)

## 2015-11-10 NOTE — ED Notes (Signed)
Patient here with complaint of RLQ abdominal pain. States onset today. Denies nausea, vomiting, diarrhea, fever. States urinary urgency, but no need to void. Abdomen firm over RLQ and RUQ. Obviously uncomfortable in triage.

## 2015-11-10 NOTE — ED Provider Notes (Addendum)
CSN: [id number]     Arrival date & time 11/10/15  1934 History  By signing my name below, I, Helane Gunther, attest that this documentation has been prepared under the direction and in the presence of Varney Biles, MD. Electronically Signed: Helane Gunther, ED Scribe. 11/11/2015. 1:03 AM.    Chief Complaint  Patient presents with  . Abdominal Pain   The history is provided by the patient. No language interpreter was used.   HPI Comments: Juan [facility] is a 40 y.o. male with a PMHx of asthma who presents to the Emergency Department complaining of constant, worsening, right-sided abdominal pain onset 13 hours ago. Pt notes the pain is worst in the RUQ and began before having eaten anything. He reports associated dizziness, lightheadedeness, urgency without increased urine, and loose stool. He reports exacerbation of the pain with walking and standing up. He reports eating a double cheeseburger this evening, which exacerbated the pain, causing him to come to the ED. He denies a PMHx of gallbladder issues and kidney stones, as well as denying a PSHx of the abdomen. Pt denies n/v, scrotal pain, and dark or tarry stool.   Past Medical History  Diagnosis Date  . Asthma   . Acne    History reviewed. No pertinent past surgical history. Family History  Problem Relation Age of Onset  . Asthma Mother    Social History  Substance Use Topics  . Smoking status: Never Smoker   . Smokeless tobacco: Never Used  . Alcohol Use: No     Comment: Denies alcohol use.    Review of Systems  Gastrointestinal: Positive for abdominal pain and diarrhea. Negative for nausea, vomiting and blood in stool.  Genitourinary: Positive for urgency and frequency. Negative for decreased urine volume, penile pain and testicular pain.  All other systems reviewed and are negative.   Allergies  Review of patient's allergies indicates no known allergies.  Home Medications   Prior to Admission medications    Medication Sig Start Date End Date Taking? Authorizing Provider  tetracycline (ACHROMYCIN,SUMYCIN) 250 MG capsule Take 1 capsule (250 mg total) by mouth 2 (two) times daily. 12/10/14  Yes Renee A Kuneff, DO  acetaminophen (TYLENOL 8 HOUR) 650 MG CR tablet Take 1 tablet (650 mg total) by mouth every 8 (eight) hours as needed for pain. 11/11/15   Varney Biles, MD  ondansetron (ZOFRAN ODT) 4 MG disintegrating tablet Take 1 tablet (4 mg total) by mouth every 8 (eight) hours as needed for nausea or vomiting. 11/11/15   Varney Biles, MD  tetracycline (ACHROMYCIN,SUMYCIN) 250 MG capsule TAKE ONE CAPSULE BY MOUTH TWICE A DAY Patient not taking: Reported on 11/10/2015 01/13/15   Coral Spikes, DO  tetracycline (ACHROMYCIN,SUMYCIN) 250 MG capsule TAKE 1 CAPSULE BY MOUTH TWICE DAILY Patient not taking: Reported on 11/10/2015 02/04/15   Coral Spikes, DO  tetracycline (ACHROMYCIN,SUMYCIN) 250 MG capsule Take 1 capsule (250 mg total) by mouth 2 (two) times daily. Patient not taking: Reported on 11/10/2015 06/17/15   Burna Cash Rumley, DO  tetracycline (ACHROMYCIN,SUMYCIN) 250 MG capsule TAKE 1 CAPSULE (250 MG TOTAL) BY MOUTH 2 (TWO) TIMES DAILY. Patient not taking: Reported on 11/10/2015 10/31/15   [place] N Rumley, DO   BP 122/67 mmHg  Pulse 64  Temp(Src) 97.9 F (36.6 C) (Oral)  Resp 14  Ht 6\' 2"  (1.88 m)  Wt 198 lb 11.2 oz (90.13 kg)  BMI 25.50 kg/m2  SpO2 97% Physical Exam  Constitutional: He is oriented to person,  place, and time. He appears well-developed and well-nourished.  HENT:  Head: Normocephalic and atraumatic.  Eyes: Conjunctivae are normal. Right eye exhibits no discharge. Left eye exhibits no discharge.  Pulmonary/Chest: Effort normal. No respiratory distress.  Abdominal: There is tenderness (RUQ). There is guarding.  Positive Murphy's sign, no McBurney's sign, no flank TTP  Neurological: He is alert and oriented to person, place, and time. Coordination normal.  Skin: Skin is warm and dry.  No rash noted. He is not diaphoretic. No erythema.  Psychiatric: He has a normal mood and affect.  Nursing note and vitals reviewed.   ED Course  Procedures  DIAGNOSTIC STUDIES: Oxygen Saturation is 99% on RA, normal by my interpretation.    COORDINATION OF CARE: 12:07 AM - Discussed possible gallbladder or kidney issue and plans to wait on diagnostic studies and imaging. Will order medication for pain and nausea. Pt advised of plan for treatment and pt agrees.  @2 :30: [place] results are normal. Patient reports pain is 2/10, however he shows the pain to be in RLQ. I repeated abd exam, and pt has + mcburneys. We will need CT to r/o appendicitis.  @5 :00: CT is neg. Still has some discomfort. No groin pain, no scrotal pain. No emesis. No uti like symptoms. Will d.c. Strict ER return precautions discussed, pcp f/u discussed.  Labs Review Labs Reviewed  COMPREHENSIVE METABOLIC PANEL - Abnormal; Notable for the following:    Glucose, Bld 127 (*)    All other components within normal limits  URINALYSIS, ROUTINE W REFLEX MICROSCOPIC (NOT AT [facility]) - Abnormal; Notable for the following:    Hgb urine dipstick LARGE (*)    All other components within normal limits  URINE MICROSCOPIC-ADD ON - Abnormal; Notable for the following:    Squamous Epithelial / LPF 0-5 (*)    Bacteria, UA RARE (*)    All other components within normal limits  LIPASE, BLOOD  CBC    Imaging Review Ct Abdomen Pelvis W Contrast  11/11/2015  CLINICAL DATA:  RIGHT upper quadrant pain began for 2 days. EXAM: CT ABDOMEN AND PELVIS WITH CONTRAST TECHNIQUE: Multidetector CT imaging of the abdomen and pelvis was performed using the standard protocol following bolus administration of intravenous contrast. CONTRAST:  133mL OMNIPAQUE IOHEXOL 300 MG/ML  SOLN COMPARISON:  RIGHT upper quadrant ultrasound November 11, 2015 at 0129 hours FINDINGS: LUNG BASES: Dependent atelectasis. Heart size is normal, no pericardial effusions. SOLID  ORGANS: The liver, spleen, gallbladder, pancreas and adrenal glands are unremarkable. GASTROINTESTINAL TRACT: Small hiatal hernia. The stomach, small and large bowel are normal in course and caliber without inflammatory changes. Small bowel feces, compatible with chronic stasis. Normal appendix. KIDNEYS/ URINARY TRACT: Kidneys are orthotopic, demonstrating symmetric enhancement. No nephrolithiasis, hydronephrosis or solid renal masses. The unopacified ureters are normal in course and caliber. Urinary bladder is partially distended and unremarkable. PERITONEUM/RETROPERITONEUM: Aortoiliac vessels are normal in course and caliber. Multiple prominent mesenteric lymph nodes without lymphadenopathy. Prostate size is normal. No intraperitoneal free fluid nor free air. SOFT TISSUE/OSSEOUS STRUCTURES: Non-suspicious. Small fat containing umbilical hernia. IMPRESSION: No acute intra-abdominal or pelvic process.  Normal appendix. Mildly prominent mesenteric lymph nodes are likely reactive. Electronically Signed   By: Elon Alas M.D.   On: 11/11/2015 04:22   US Abdomen Limited Ruq  11/11/2015  CLINICAL DATA:  Acute onset of right upper quadrant abdominal pain. Certain EXAM: US ABDOMEN LIMITED - RIGHT UPPER QUADRANT COMPARISON:  None. FINDINGS: Gallbladder: No gallstones or wall thickening visualized. No sonographic  Murphy sign noted by sonographer. Common bile duct: Diameter: 0.2 cm, within normal limits in caliber. Liver: No focal lesion identified. Within normal limits in parenchymal echogenicity. IMPRESSION: Unremarkable ultrasound of the right upper quadrant. Electronically Signed   By: Garald Balding M.D.   On: 11/11/2015 01:44   I have personally reviewed and evaluated these images and lab results as part of my medical decision-making.   EKG Interpretation None      MDM   Final diagnoses:  Abdominal pain    Medical screening examination/treatment/procedure(s) were performed by me as the  supervising physician. Scribe service was utilized for documentation only.  Pt comes in with cc of abd pain. Abd pain is RUQ, and we have clinical concerns for cholelithiasis. We will start with [place], keep him NPO, give him fluids. If [place] is neg, we will get CT.  Varney Biles, MD 11/11/15 0127  Varney Biles, MD 11/11/15 [place], MD 11/11/15 [id number]

## 2015-11-11 ENCOUNTER — Emergency Department (HOSPITAL_COMMUNITY): Payer: Managed Care, Other (non HMO)

## 2015-11-11 ENCOUNTER — Encounter (HOSPITAL_COMMUNITY): Payer: Self-pay | Admitting: Radiology

## 2015-11-11 MED ORDER — ACETAMINOPHEN ER 650 MG PO TBCR: 650.0000 mg | EXTENDED_RELEASE_TABLET | Freq: Three times a day (TID) | ORAL | Status: DC | PRN

## 2015-11-11 MED ORDER — FAMOTIDINE IN NACL 20-0.9 MG/50ML-% IV SOLN
20.0000 mg | Freq: Once | INTRAVENOUS | Status: AC
  Administered 2015-11-11: 20 mg via INTRAVENOUS
  Filled 2015-11-11: qty 50

## 2015-11-11 MED ORDER — MORPHINE SULFATE (PF) 4 MG/ML IV SOLN
4.0000 mg | INTRAVENOUS | Status: DC | PRN
  Administered 2015-11-11: 4 mg via INTRAVENOUS
  Filled 2015-11-11: qty 1

## 2015-11-11 MED ORDER — HYDROCODONE-ACETAMINOPHEN 5-325 MG PO TABS
1.0000 | ORAL_TABLET | Freq: Once | ORAL | Status: AC
  Administered 2015-11-11: 1 via ORAL
  Filled 2015-11-11: qty 1

## 2015-11-11 MED ORDER — IOHEXOL 300 MG/ML  SOLN
100.0000 mL | Freq: Once | INTRAMUSCULAR | Status: AC | PRN
  Administered 2015-11-11: 100 mL via INTRAVENOUS

## 2015-11-11 MED ORDER — ONDANSETRON HCL 4 MG/2ML IJ SOLN
4.0000 mg | Freq: Four times a day (QID) | INTRAMUSCULAR | Status: DC | PRN
  Administered 2015-11-11: 4 mg via INTRAVENOUS
  Filled 2015-11-11: qty 2

## 2015-11-11 MED ORDER — ONDANSETRON 4 MG PO TBDP
4.0000 mg | ORAL_TABLET | Freq: Three times a day (TID) | ORAL | Status: DC | PRN
Start: 2015-11-11 — End: 2016-06-02

## 2015-11-11 NOTE — Discharge Instructions (Signed)
We saw you in the ER for the abdominal pain. All of our results are normal, including all labs and imaging. Kidney function is fine as well. We are not sure what is causing your abdominal pain, and recommend that you see your primary care doctor within 2-3 days for further evaluation. If your symptoms get worse, return to the ER. Take the pain meds and nausea meds as prescribed.  Please return to the ER if your symptoms worsen; you have increased pain, fevers, chills, inability to keep any medications down, confusion. Otherwise see the outpatient doctor as requested.   Abdominal Pain, Adult Many things can cause abdominal pain. Usually, abdominal pain is not caused by a disease and will improve without treatment. It can often be observed and treated at home. Your health care provider will do a physical exam and possibly order blood tests and X-rays to help determine the seriousness of your pain. However, in many cases, more time must pass before a clear cause of the pain can be found. Before that point, your health care provider may not know if you need more testing or further treatment. HOME CARE INSTRUCTIONS Monitor your abdominal pain for any changes. The following actions may help to alleviate any discomfort you are experiencing:  Only take over-the-counter or prescription medicines as directed by your health care provider.  Do not take laxatives unless directed to do so by your health care provider.  Try a clear liquid diet (broth, tea, or water) as directed by your health care provider. Slowly move to a bland diet as tolerated. SEEK MEDICAL CARE IF:  You have unexplained abdominal pain.  You have abdominal pain associated with nausea or diarrhea.  You have pain when you urinate or have a bowel movement.  You experience abdominal pain that wakes you in the night.  You have abdominal pain that is worsened or improved by eating food.  You have abdominal pain that is worsened with  eating fatty foods.  You have a fever. SEEK IMMEDIATE MEDICAL CARE IF:  Your pain does not go away within 2 hours.  You keep throwing up (vomiting).  Your pain is felt only in portions of the abdomen, such as the right side or the left lower portion of the abdomen.  You pass bloody or black tarry stools. MAKE SURE YOU:  Understand these instructions.  Will watch your condition.  Will get help right away if you are not doing well or get worse.   This information is not intended to replace advice given to you by your health care provider. Make sure you discuss any questions you have with your health care provider.   Document Released: 06/09/2005 Document Revised: 05/21/2015 Document Reviewed: 05/09/2013 Elsevier Interactive Patient Education 2016 [place] Liquid Diet A clear liquid diet is a short-term diet that is prescribed to provide the necessary fluid and basic energy you need when you can have nothing else. The clear liquid diet consists of liquids or solids that will become liquid at room temperature. You should be able to see through the liquid. There are many reasons that you may be restricted to clear liquids, such as:  When you have a sudden-onset (acute) condition that occurs before or after surgery.  To help your body slowly get adjusted to food again after a long period when you were unable to have food.  Replacement of fluids when you have a diarrheal disease.  When you are going to have certain exams, such as a  colonoscopy, in which instruments are inserted inside your body to look at parts of your digestive system. WHAT CAN I HAVE? A clear liquid diet does not provide all the nutrients you need. It is important to choose a variety of the following items to get as many nutrients as possible:  Vegetable juices that do not have pulp.  Fruit juices and fruit drinks that do not have pulp.  Coffee (regular or decaffeinated), tea, or soda at the discretion  of your health care provider.  Clear bouillon, broth, or strained broth-based soups.  High-protein and flavored gelatins.  Sugar or honey.  Ices or frozen ice pops that do not contain milk. If you are not sure whether you can have certain items, you should ask your health care provider. You may also ask your health care provider if there are any other clear liquid options.   This information is not intended to replace advice given to you by your health care provider. Make sure you discuss any questions you have with your health care provider.   Document Released: 08/30/2005 Document Revised: 09/04/2013 Document Reviewed: 07/27/2013 Elsevier Interactive Patient Education [employer].

## 2015-11-11 NOTE — ED Notes (Signed)
Patient transported to Ultrasound 

## 2015-11-11 NOTE — ED Notes (Signed)
Patient transported to CT 

## 2015-11-12 ENCOUNTER — Encounter (HOSPITAL_COMMUNITY): Payer: Self-pay | Admitting: Emergency Medicine

## 2015-11-12 ENCOUNTER — Emergency Department (HOSPITAL_COMMUNITY)
Admission: EM | Admit: 2015-11-12 | Discharge: 2015-11-13 | Disposition: A | Discharge status: Home / Self Care | Payer: Managed Care, Other (non HMO) | Attending: Emergency Medicine | Admitting: Emergency Medicine

## 2015-11-12 DIAGNOSIS — R350 Frequency of micturition: Secondary | ICD-10-CM | POA: Insufficient documentation

## 2015-11-12 DIAGNOSIS — Z792 Long term (current) use of antibiotics: Secondary | ICD-10-CM | POA: Insufficient documentation

## 2015-11-12 DIAGNOSIS — R109 Unspecified abdominal pain: Secondary | ICD-10-CM

## 2015-11-12 DIAGNOSIS — R1011 Right upper quadrant pain: Secondary | ICD-10-CM | POA: Diagnosis present

## 2015-11-12 DIAGNOSIS — J45909 Unspecified asthma, uncomplicated: Secondary | ICD-10-CM | POA: Insufficient documentation

## 2015-11-12 DIAGNOSIS — Z872 Personal history of diseases of the skin and subcutaneous tissue: Secondary | ICD-10-CM | POA: Insufficient documentation

## 2015-11-12 LAB — COMPREHENSIVE METABOLIC PANEL
ALT: 22 U/L (ref 17–63)
AST: 23 U/L (ref 15–41)
Albumin: 4.2 g/dL (ref 3.5–5.0)
Alkaline Phosphatase: 58 U/L (ref 38–126)
Anion gap: 9 (ref 5–15)
BILIRUBIN TOTAL: 0.2 mg/dL — AB (ref 0.3–1.2)
BUN: 15 mg/dL (ref 6–20)
CALCIUM: 9.8 mg/dL (ref 8.9–10.3)
CO2: 28 mmol/L (ref 22–32)
CREATININE: 1.22 mg/dL (ref 0.61–1.24)
Chloride: 103 mmol/L (ref 101–111)
Glucose, Bld: 113 mg/dL — ABNORMAL HIGH (ref 65–99)
Potassium: 4.2 mmol/L (ref 3.5–5.1)
Sodium: 140 mmol/L (ref 135–145)
TOTAL PROTEIN: 7.4 g/dL (ref 6.5–8.1)

## 2015-11-12 LAB — URINALYSIS, ROUTINE W REFLEX MICROSCOPIC
Bilirubin Urine: NEGATIVE
GLUCOSE, UA: NEGATIVE mg/dL
KETONES UR: NEGATIVE mg/dL
LEUKOCYTES UA: NEGATIVE
NITRITE: NEGATIVE
PROTEIN: NEGATIVE mg/dL
Specific Gravity, Urine: 1.017 (ref 1.005–1.030)
pH: 6 (ref 5.0–8.0)

## 2015-11-12 LAB — URINE MICROSCOPIC-ADD ON: SQUAMOUS EPITHELIAL / LPF: NONE SEEN

## 2015-11-12 LAB — CBC
HCT: 40.2 % (ref 39.0–52.0)
Hemoglobin: 14.2 g/dL (ref 13.0–17.0)
MCH: 32.3 pg (ref 26.0–34.0)
MCHC: 35.3 g/dL (ref 30.0–36.0)
MCV: 91.6 fL (ref 78.0–100.0)
PLATELETS: 381 10*3/uL (ref 150–400)
RBC: 4.39 MIL/uL (ref 4.22–5.81)
RDW: 12.4 % (ref 11.5–15.5)
WBC: 7.1 10*3/uL (ref 4.0–10.5)

## 2015-11-12 LAB — LIPASE, BLOOD: LIPASE: 54 U/L — AB (ref 11–51)

## 2015-11-12 MED ORDER — FENTANYL CITRATE (PF) 100 MCG/2ML IJ SOLN
50.0000 ug | Freq: Once | INTRAMUSCULAR | Status: AC
  Administered 2015-11-13: 50 ug via INTRAVENOUS
  Filled 2015-11-12: qty 2

## 2015-11-12 NOTE — ED Provider Notes (Signed)
CSN: [id number]     Arrival date & time 11/12/15  2202 History  By signing my name below, I, Jolayne Panther, attest that this documentation has been prepared under the direction and in the presence of Comer Locket, PA-C. Electronically Signed: Jolayne Panther, Scribe. 11/12/2015. 11:46 PM.   Chief Complaint  Patient presents with  . Abdominal Pain   The history is provided by the patient. No language interpreter was used.    HPI Comments: Juan Rogers is a 40 y.o. male who presents to the Emergency Department complaining of sudden onset, constant, moderate, non-radiating, twisting RUQ abdominal pain which began two days ago. Pain is constant at baseline, but will have intermittent waves of worsening pain. Pt also reports associated urinary frequency and notes that he has had about eight bowel movements today. No dark or bloody stool. Pt has taken ibuprofen 600 mg for his pain with little to no relief. He denies hx of kidney stones, rectal pain, scrotal pain, testicular pain, penile discharge, cough, fever, nausea, vomiting, diarrhea, constipation, rash, sick contact, SOB, and pain with bowel movements. Pt does not drink alcohol.   Past Medical History  Diagnosis Date  . Asthma   . Acne    History reviewed. No pertinent past surgical history. Family History  Problem Relation Age of Onset  . Asthma Mother    Social History  Substance Use Topics  . Smoking status: Never Smoker   . Smokeless tobacco: Never Used  . Alcohol Use: No     Comment: Denies alcohol use.    Review of Systems A complete 10 system review of systems was obtained and all systems are negative except as noted in the HPI and PMH.   Allergies  Review of patient's allergies indicates no known allergies.  Home Medications   Prior to Admission medications   Medication Sig Start Date End Date Taking? Authorizing Provider  ibuprofen (ADVIL,MOTRIN) 200 MG tablet Take 600 mg by mouth every 6 (six) hours  as needed for moderate pain.   Yes Historical Provider, MD  phenazopyridine (PYRIDIUM) 95 MG tablet Take 95 mg by mouth 3 (three) times daily as needed for pain.   Yes Historical Provider, MD  tetracycline (ACHROMYCIN,SUMYCIN) 250 MG capsule Take 1 capsule (250 mg total) by mouth 2 (two) times daily. 12/10/14  Yes Renee A Kuneff, DO  acetaminophen (TYLENOL 8 HOUR) 650 MG CR tablet Take 1 tablet (650 mg total) by mouth every 8 (eight) hours as needed for pain. Patient not taking: Reported on 11/12/2015 11/11/15   Varney Biles, MD  HYDROcodone-acetaminophen (NORCO/VICODIN) 5-325 MG tablet Take 1-2 tablets by mouth every 4 (four) hours as needed. 11/13/15   Comer Locket, PA-C  ondansetron (ZOFRAN ODT) 4 MG disintegrating tablet Take 1 tablet (4 mg total) by mouth every 8 (eight) hours as needed for nausea or vomiting. Patient not taking: Reported on 11/12/2015 11/11/15   Varney Biles, MD  ondansetron (ZOFRAN) 4 MG tablet Take 1 tablet (4 mg total) by mouth every 6 (six) hours. 11/13/15   Comer Locket, PA-C  tamsulosin (FLOMAX) 0.4 MG CAPS capsule Take 1 capsule (0.4 mg total) by mouth 2 (two) times daily. 11/13/15   Comer Locket, PA-C  tetracycline (ACHROMYCIN,SUMYCIN) 250 MG capsule TAKE ONE CAPSULE BY MOUTH TWICE A DAY Patient not taking: Reported on 11/10/2015 01/13/15   Coral Spikes, DO  tetracycline (ACHROMYCIN,SUMYCIN) 250 MG capsule TAKE 1 CAPSULE BY MOUTH TWICE DAILY Patient not taking: Reported on 11/10/2015 02/04/15  Coral Spikes, DO  tetracycline (ACHROMYCIN,SUMYCIN) 250 MG capsule Take 1 capsule (250 mg total) by mouth 2 (two) times daily. Patient not taking: Reported on 11/10/2015 06/17/15   Burna Cash Rumley, DO  tetracycline (ACHROMYCIN,SUMYCIN) 250 MG capsule TAKE 1 CAPSULE (250 MG TOTAL) BY MOUTH 2 (TWO) TIMES DAILY. Patient not taking: Reported on 11/10/2015 10/31/15   Chili N Rumley, DO   BP 135/78 mmHg  Pulse 80  Temp(Src) 98.1 F (36.7 C) (Oral)  Resp 18  SpO2 98% Physical Exam   Constitutional: He is oriented to person, place, and time. He appears well-developed and well-nourished. No distress.  HENT:  Head: Normocephalic and atraumatic.  Mouth/Throat: Oropharynx is clear and moist.  Eyes: Conjunctivae are normal. Pupils are equal, round, and reactive to light. Right eye exhibits no discharge. Left eye exhibits no discharge. No scleral icterus.  Neck: Neck supple.  Cardiovascular: Normal rate, regular rhythm and normal heart sounds.   Pulmonary/Chest: Effort normal and breath sounds normal. No respiratory distress. He has no wheezes. He has no rales.  Abdominal: Soft. He exhibits no distension.  Small area of focal tenderness over lateral right upper quadrant. No lesions or abnormalities. Abdomen not distended, no rebound or guarding.   Musculoskeletal: He exhibits no tenderness.  Neurological: He is alert and oriented to person, place, and time.  Cranial Nerves II-XII grossly intact  Skin: Skin is warm and dry. No rash noted.  Psychiatric: He has a normal mood and affect.  Nursing note and vitals reviewed.   ED Course  Procedures  DIAGNOSTIC STUDIES:    Oxygen Saturation is 98% on RA, normal by my interpretation.   COORDINATION OF CARE:  11:46 PM Will administer medication in the ED. Will review labs and imaging. Discussed treatment plan with pt at bedside and pt agreed to plan.    Labs Review Labs Reviewed  LIPASE, BLOOD - Abnormal; Notable for the following:    Lipase 54 (*)    All other components within normal limits  COMPREHENSIVE METABOLIC PANEL - Abnormal; Notable for the following:    Glucose, Bld 113 (*)    Total Bilirubin 0.2 (*)    All other components within normal limits  URINALYSIS, ROUTINE W REFLEX MICROSCOPIC (NOT AT [facility]) - Abnormal; Notable for the following:    Hgb urine dipstick MODERATE (*)    All other components within normal limits  URINE MICROSCOPIC-ADD ON - Abnormal; Notable for the following:    Bacteria, UA RARE (*)     All other components within normal limits  CBC    Imaging Review No results found. I have personally reviewed and evaluated these images and lab results as part of my medical decision-making.   EKG Interpretation None     Meds given in ED:  Medications  fentaNYL (SUBLIMAZE) injection 50 mcg (50 mcg Intravenous Given 11/13/15 0005)    Discharge Medication List as of 11/13/2015 12:13 AM    START taking these medications   Details  HYDROcodone-acetaminophen (NORCO/VICODIN) 5-325 MG tablet Take 1-2 tablets by mouth every 4 (four) hours as needed., Starting 11/13/2015, Until Discontinued, Print    ondansetron (ZOFRAN) 4 MG tablet Take 1 tablet (4 mg total) by mouth every 6 (six) hours., Starting 11/13/2015, Until Discontinued, Print    tamsulosin (FLOMAX) 0.4 MG CAPS capsule Take 1 capsule (0.4 mg total) by mouth 2 (two) times daily., Starting 11/13/2015, Until Discontinued, Print       Filed Vitals:   11/12/15 2226 11/13/15 0032  BP:  137/95 135/78  Pulse: 88 80  Temp: 98.1 F (36.7 C)   TempSrc: Oral   Resp: 18 18  SpO2: 98% 98%    MDM  Juan Rogers is a 40 y.o. male with no significant past medical history who comes in for evaluation of right-sided abdominal pain ongoing for the past one week. Patient was seen and evaluated in the emergency department on 2/28 for same symptoms with negative right upper quadrant ultrasound as well as negative CT abdomen. Symptoms are essentially unchanged today. Labs were grossly unremarkable at that time as they are today. However, patient did have large hemoglobin in his urine during his last visit, moderate hemoglobin today. Abdominal exam is not concerning for acute or surgical abdomen. Given patient's history of constant pain that seems to have a waxing and waning intensity, report of increased urinary frequency, suspect possible nonobstructing stone. We'll treat empirically with pain medicines, tamsulosin, nausea medicine. Referral for urology  if symptoms do not improve. Overall, patient appears well, nontoxic, hemodynamically stable and in no apparent distress. Appropriate for outpatient follow-up. The patient appears reasonably screened and/or stabilized for discharge and I doubt any other medical condition or other [facility] requiring further screening, evaluation, or treatment in the ED at this time prior to discharge.   Final diagnoses:  Right sided abdominal pain    I personally performed the services described in this documentation, which was scribed in my presence. The recorded information has been reviewed and is accurate.    Comer Locket, PA-C 11/13/15 [place], MD 11/13/15 [phone]

## 2015-11-12 NOTE — ED Notes (Signed)
Pt states that he has had RLQ pain that he was evaluated for but the pain has persisted and he has frequent urination. Alert and oriented.

## 2015-11-13 MED ORDER — TAMSULOSIN HCL 0.4 MG PO CAPS: 0.4000 mg | ORAL_CAPSULE | Freq: Two times a day (BID) | ORAL | Status: DC

## 2015-11-13 MED ORDER — HYDROCODONE-ACETAMINOPHEN 5-325 MG PO TABS: 1.0000 | ORAL_TABLET | ORAL | Status: DC | PRN

## 2015-11-13 MED ORDER — ONDANSETRON HCL 4 MG PO TABS: 4.0000 mg | ORAL_TABLET | Freq: Four times a day (QID) | ORAL | Status: DC

## 2015-11-13 NOTE — Discharge Instructions (Signed)
There does not appear to be an emergent cause for your symptoms at this time. Your discomfort is possibly due to a kidney stone. You will be treated for this problem with medications. Take your medications as prescribed. He may also follow-up with urology if your symptoms do not improve over the next 3 days. Return to ED for new or worsening symptoms as we discussed.  Flank Pain Flank pain refers to pain that is located on the side of the body between the upper abdomen and the back. The pain may occur over a short period of time (acute) or may be long-term or reoccurring (chronic). It may be mild or severe. Flank pain can be caused by many things. CAUSES  Some of the more common causes of flank pain include:  Muscle strains.   Muscle spasms.   A disease of your spine (vertebral disk disease).   A lung infection (pneumonia).   Fluid around your lungs (pulmonary edema).   A kidney infection.   Kidney stones.   A very painful skin rash caused by the chickenpox virus (shingles).   Gallbladder disease.  [place] care will depend on the cause of your pain. In general,  Rest as directed by your caregiver.  Drink enough fluids to keep your urine clear or pale yellow.  Only take over-the-counter or prescription medicines as directed by your caregiver. Some medicines may help relieve the pain.  Tell your caregiver about any changes in your pain.  Follow up with your caregiver as directed. SEEK IMMEDIATE MEDICAL CARE IF:   Your pain is not controlled with medicine.   You have new or worsening symptoms.  Your pain increases.   You have abdominal pain.   You have shortness of breath.   You have persistent nausea or vomiting.   You have swelling in your abdomen.   You feel faint or pass out.   You have blood in your urine.  You have a fever or persistent symptoms for more than 2-3 days.  You have a fever and your symptoms suddenly get  worse. MAKE SURE YOU:   Understand these instructions.  Will watch your condition.  Will get help right away if you are not doing well or get worse.   This information is not intended to replace advice given to you by your health care provider. Make sure you discuss any questions you have with your health care provider.   Document Released: 10/21/2005 Document Revised: 05/24/2012 Document Reviewed: 04/13/2012 Elsevier Interactive Patient Education [employer].

## 2016-03-24 ENCOUNTER — Other Ambulatory Visit: Payer: Self-pay | Admitting: Family Medicine

## 2016-03-25 ENCOUNTER — Other Ambulatory Visit: Payer: Self-pay | Admitting: Family Medicine

## 2016-03-29 ENCOUNTER — Ambulatory Visit: Payer: BLUE CROSS/BLUE SHIELD | Admitting: Family Medicine

## 2016-04-01 ENCOUNTER — Ambulatory Visit: Payer: BLUE CROSS/BLUE SHIELD | Admitting: Family Medicine

## 2016-04-13 ENCOUNTER — Ambulatory Visit: Payer: BLUE CROSS/BLUE SHIELD | Admitting: Family Medicine

## 2016-05-31 ENCOUNTER — Emergency Department (HOSPITAL_COMMUNITY): Payer: Managed Care, Other (non HMO)

## 2016-05-31 ENCOUNTER — Emergency Department (HOSPITAL_COMMUNITY)
Admission: EM | Admit: 2016-05-31 | Discharge: 2016-05-31 | Disposition: A | Discharge status: Home / Self Care | Payer: Managed Care, Other (non HMO) | Attending: Emergency Medicine | Admitting: Emergency Medicine

## 2016-05-31 DIAGNOSIS — J45909 Unspecified asthma, uncomplicated: Secondary | ICD-10-CM | POA: Insufficient documentation

## 2016-05-31 DIAGNOSIS — I4891 Unspecified atrial fibrillation: Secondary | ICD-10-CM

## 2016-05-31 DIAGNOSIS — R55 Syncope and collapse: Secondary | ICD-10-CM | POA: Diagnosis present

## 2016-05-31 DIAGNOSIS — Z79899 Other long term (current) drug therapy: Secondary | ICD-10-CM | POA: Insufficient documentation

## 2016-05-31 LAB — CBC
HCT: 43.3 % (ref 39.0–52.0)
Hemoglobin: 15 g/dL (ref 13.0–17.0)
MCH: 30.9 pg (ref 26.0–34.0)
MCHC: 34.6 g/dL (ref 30.0–36.0)
MCV: 89.1 fL (ref 78.0–100.0)
PLATELETS: 322 10*3/uL (ref 150–400)
RBC: 4.86 MIL/uL (ref 4.22–5.81)
RDW: 12.5 % (ref 11.5–15.5)
WBC: 6.6 10*3/uL (ref 4.0–10.5)

## 2016-05-31 LAB — BASIC METABOLIC PANEL
Anion gap: 7 (ref 5–15)
BUN: 15 mg/dL (ref 6–20)
CO2: 27 mmol/L (ref 22–32)
CREATININE: 1.29 mg/dL — AB (ref 0.61–1.24)
Calcium: 9.6 mg/dL (ref 8.9–10.3)
Chloride: 105 mmol/L (ref 101–111)
GFR calc non Af Amer: >60 mL/min (ref >60)
Glucose, Bld: 124 mg/dL — ABNORMAL HIGH (ref 65–99)
Potassium: 4 mmol/L (ref 3.5–5.1)
SODIUM: 139 mmol/L (ref 135–145)

## 2016-05-31 LAB — TROPONIN I: Troponin I: <0.03 ng/mL (ref <0.03)

## 2016-05-31 LAB — CBG MONITORING, ED: GLUCOSE-CAPILLARY: 136 mg/dL — AB (ref 65–99)

## 2016-05-31 LAB — TSH: TSH: 2.5 u[IU]/mL (ref 0.350–4.500)

## 2016-05-31 LAB — MAGNESIUM: Magnesium: 2.4 mg/dL (ref 1.7–2.4)

## 2016-05-31 MED ORDER — RIVAROXABAN 20 MG PO TABS: 20.0000 mg | ORAL_TABLET | Freq: Every day | ORAL | 0 refills | Status: DC

## 2016-05-31 MED ORDER — ETOMIDATE 2 MG/ML IV SOLN
10.0000 mg | Freq: Once | INTRAVENOUS | Status: AC
  Administered 2016-05-31: 10 mg via INTRAVENOUS
  Filled 2016-05-31: qty 10

## 2016-05-31 MED ORDER — SODIUM CHLORIDE 0.9 % IV BOLUS (SEPSIS)
1000.0000 mL | Freq: Once | INTRAVENOUS | Status: AC
  Administered 2016-05-31: 1000 mL via INTRAVENOUS

## 2016-05-31 MED ORDER — RIVAROXABAN 20 MG PO TABS
20.0000 mg | ORAL_TABLET | Freq: Once | ORAL | Status: AC
Start: 2016-05-31 — End: 2016-05-31
  Administered 2016-05-31: 20 mg via ORAL
  Filled 2016-05-31: qty 1

## 2016-05-31 NOTE — ED Triage Notes (Signed)
Pt presents with weakness and dizziness and states that he passed out at work and threw up 4 times.  Pt waited for about an hour til he could drive himself to the hospital.  Pt is tachycardic and chest is a bit clammy. Pt denies SHOB or nausea or chest pain.

## 2016-05-31 NOTE — ED Notes (Signed)
MD at bedside. 

## 2016-05-31 NOTE — ED Notes (Signed)
Pt cannot use restroom at this time, aware urine specimen is needed.  

## 2016-05-31 NOTE — ED Notes (Signed)
Cardioversion synchronized at 100 Joules.

## 2016-05-31 NOTE — ED Notes (Signed)
Pharmacist en route to speak with pt regarding new medication.

## 2016-05-31 NOTE — Discharge Instructions (Signed)

## 2016-05-31 NOTE — ED Provider Notes (Signed)
[place] DEPT Provider Note   CSN: [id number] Arrival date & time: 05/31/16  0957     History   Chief Complaint Chief Complaint  Patient presents with  . Weakness  . Dizziness  . Near Syncope    HPI Juan Rogers is a 40 y.o. male.  HPI 40 yo M with PMHx of mild asthma who p/w acute onset palpitations and near syncope 2 hr prior to arrival. Pt was in his usual state of health upon waking this AM. He presented to work where, at around 8:30 AM, he experienced acute onset of palpitations, SOB, and sensation he was going to pass out. His boss saw him and called 911 to present to the ED. NO known full syncope. On arrival here, pt states he feels fatigued, palpitations, and lightheaded. No CP. Denies any recent drug or heavy caffeine use. No h/o AFib. No family h/o heart disease. No recent fevers or chills.  Past Medical History:  Diagnosis Date  . Acne   . Asthma     Patient Active Problem List   Diagnosis Date Noted  . ABNORMAL FINDINGS, ELEVATED BP W/O HTN 05/11/2007  . Inflammatory acne 11/10/2006    No past surgical history on file.     Home Medications    Prior to Admission medications   Medication Sig Start Date End Date Taking? Authorizing Provider  acetaminophen (TYLENOL 8 HOUR) 650 MG CR tablet Take 1 tablet (650 mg total) by mouth every 8 (eight) hours as needed for pain. Patient not taking: Reported on 11/12/2015 11/11/15   Varney Biles, MD  HYDROcodone-acetaminophen (NORCO/VICODIN) 5-325 MG tablet Take 1-2 tablets by mouth every 4 (four) hours as needed. Patient not taking: Reported on 05/31/2016 11/13/15   Comer Locket, PA-C  ondansetron (ZOFRAN ODT) 4 MG disintegrating tablet Take 1 tablet (4 mg total) by mouth every 8 (eight) hours as needed for nausea or vomiting. Patient not taking: Reported on 11/12/2015 11/11/15   Varney Biles, MD  ondansetron (ZOFRAN) 4 MG tablet Take 1 tablet (4 mg total) by mouth every 6 (six) hours. Patient not taking:  Reported on 05/31/2016 11/13/15   Comer Locket, PA-C  rivaroxaban (XARELTO) 20 MG TABS tablet Take 1 tablet (20 mg total) by mouth daily with supper. 05/31/16   Duffy Bruce, MD  tamsulosin (FLOMAX) 0.4 MG CAPS capsule Take 1 capsule (0.4 mg total) by mouth 2 (two) times daily. Patient not taking: Reported on 05/31/2016 11/13/15   Comer Locket, PA-C  tetracycline (ACHROMYCIN,SUMYCIN) 250 MG capsule Take 1 capsule (250 mg total) by mouth 2 (two) times daily. Patient not taking: Reported on 05/31/2016 12/10/14   Renee A Kuneff, DO  tetracycline (ACHROMYCIN,SUMYCIN) 250 MG capsule TAKE ONE CAPSULE BY MOUTH TWICE A DAY Patient not taking: Reported on 11/10/2015 01/13/15   Coral Spikes, DO  tetracycline (ACHROMYCIN,SUMYCIN) 250 MG capsule TAKE 1 CAPSULE BY MOUTH TWICE DAILY Patient not taking: Reported on 11/10/2015 02/04/15   Coral Spikes, DO  tetracycline (ACHROMYCIN,SUMYCIN) 250 MG capsule Take 1 capsule (250 mg total) by mouth 2 (two) times daily. Patient not taking: Reported on 11/10/2015 06/17/15   Burna Cash Rumley, DO  tetracycline (ACHROMYCIN,SUMYCIN) 250 MG capsule TAKE 1 CAPSULE (250 MG TOTAL) BY MOUTH 2 (TWO) TIMES DAILY. Patient not taking: Reported on 11/10/2015 10/31/15   Lorna Few, DO    Family History Family History  Problem Relation Age of Onset  . Asthma Mother     Social History Social History  Substance Use Topics  .  Smoking status: Never Smoker  . Smokeless tobacco: Never Used  . Alcohol use No     Comment: Denies alcohol use.     Allergies   Review of patient's allergies indicates no known allergies.   Review of Systems Review of Systems  Constitutional: Positive for fatigue. Negative for chills and fever.  HENT: Negative for congestion and rhinorrhea.   Eyes: Negative for visual disturbance.  Respiratory: Positive for shortness of breath. Negative for cough and wheezing.   Cardiovascular: Positive for palpitations. Negative for chest pain and leg swelling.    Gastrointestinal: Negative for abdominal pain, diarrhea, nausea and vomiting.  Genitourinary: Negative for dysuria and flank pain.  Musculoskeletal: Negative for neck pain and neck stiffness.  Skin: Negative for rash and wound.  Allergic/Immunologic: Negative for immunocompromised state.  Neurological: Positive for light-headedness. Negative for syncope, weakness and headaches.  All other systems reviewed and are negative.    Physical Exam Updated Vital Signs BP 120/81 (BP Location: Left Arm)   Pulse 66   Temp 98.5 F (36.9 C) (Oral)   Resp 18   Ht 6\' 4"  (1.93 m)   Wt 194 lb (88 kg)   SpO2 100%   BMI 23.61 kg/m   Physical Exam  Constitutional: He is oriented to person, place, and time. He appears well-developed and well-nourished. No distress.  HENT:  Head: Normocephalic and atraumatic.  Eyes: Conjunctivae are normal.  Neck: Neck supple.  Cardiovascular: Normal heart sounds.  An irregularly irregular rhythm present. Tachycardia present.  Exam reveals no friction rub and no decreased pulses.   No murmur heard. Pulmonary/Chest: Effort normal and breath sounds normal. No respiratory distress. He has no wheezes. He has no rales.  Abdominal: He exhibits no distension.  Musculoskeletal: He exhibits no edema.  Neurological: He is alert and oriented to person, place, and time. He exhibits normal muscle tone.  Skin: Skin is warm. Capillary refill takes less than 2 seconds.  Psychiatric: He has a normal mood and affect.  Nursing note and vitals reviewed.    ED Treatments / Results  Labs (all labs ordered are listed, but only abnormal results are displayed) Labs Reviewed  BASIC METABOLIC PANEL - Abnormal; Notable for the following:       Result Value   Glucose, Bld 124 (*)    Creatinine, Ser 1.29 (*)    All other components within normal limits  CBG MONITORING, ED - Abnormal; Notable for the following:    Glucose-Capillary 136 (*)    All other components within normal  limits  CBC  TROPONIN I  MAGNESIUM  TSH    EKG  EKG Interpretation  Date/Time:  Monday May 31 2016 11:12:17 EDT Ventricular Rate:  71 PR Interval:    QRS Duration: 65 QT Interval:  343 QTC Calculation: 373 R Axis:   66 Text Interpretation:  Sinus arrhythmia Baseline wander in lead(s) V2 Since last tracing Normal sinus rhythm has replaced atrial fibrillation Non-specific ST changes have resolved Confirmed by Ellender Hose MD, Lysbeth Galas [phone]) on 06/01/2016 8:26:55 AM       Radiology Dg Chest Portable 1 View  Result Date: 05/31/2016 CLINICAL DATA:  Left-sided chest pain and shortness of breath. EXAM: PORTABLE CHEST 1 VIEW COMPARISON:  None. FINDINGS: The heart size and mediastinal contours are within normal limits. There is no evidence of pulmonary edema, consolidation, pneumothorax, nodule or pleural fluid. The visualized skeletal structures are unremarkable. IMPRESSION: No active disease. Electronically Signed   By: Aletta Edouard M.D.   On:  05/31/2016 10:50    Procedures .Critical Care Performed by: Duffy Bruce Authorized by: Duffy Bruce   Critical care provider statement:    Critical care time (minutes):  45   Critical care time was exclusive of:  Separately billable procedures and treating other patients   Critical care was necessary to treat or prevent imminent or life-threatening deterioration of the following conditions:  Circulatory failure and cardiac failure   Critical care was time spent personally by me on the following activities:  Development of treatment plan with patient or surrogate, ordering and performing treatments and interventions, ordering and review of laboratory studies, ordering and review of radiographic studies, pulse oximetry, re-evaluation of patient's condition, review of old charts, obtaining history from patient or surrogate, evaluation of patient's response to treatment and examination of patient   I assumed direction of critical care for  this patient from another provider in my specialty: no   .Cardioversion Date/Time: 06/01/2016 8:29 AM Performed by: Duffy Bruce Authorized by: Duffy Bruce   Consent:    Consent obtained:  Written   Consent given by:  Patient   Risks discussed:  Cutaneous burn, death, induced arrhythmia and pain   Alternatives discussed:  Alternative treatment and rate-control medication Pre-procedure details:    Cardioversion basis:  Emergent   Rhythm:  Atrial fibrillation   Electrode placement:  Anterior-posterior Attempt one:    Cardioversion mode:  Synchronous   Waveform:  Biphasic   Shock (Joules):  100   Shock outcome:  Conversion to normal sinus rhythm Post-procedure details:    Patient status:  Awake   Patient tolerance of procedure:  Tolerated well, no immediate complications .Sedation Date/Time: 06/01/2016 8:30 AM Performed by: Duffy Bruce Authorized by: Duffy Bruce   Consent:    Consent obtained:  Written   Consent given by:  Patient   Risks discussed:  Allergic reaction, dysrhythmia, inadequate sedation, nausea, vomiting, respiratory compromise necessitating ventilatory assistance and intubation, prolonged sedation necessitating reversal and prolonged hypoxia resulting in organ damage   Alternatives discussed:  Analgesia without sedation Indications:    Procedure performed:  Cardioversion   Procedure necessitating sedation performed by:  Physician performing sedation   Intended level of sedation:  Moderate (conscious sedation) Pre-sedation assessment:    NPO status caution: urgency dictates proceeding with non-ideal NPO status     ASA classification: class 1 - normal, healthy patient     Neck mobility: normal     Mouth opening:  3 or more finger widths   Thyromental distance:  4 finger widths   Mallampati score:  I - soft palate, uvula, fauces, pillars visible   Pre-sedation assessments completed and reviewed: airway patency, cardiovascular function, hydration  status, mental status, nausea/vomiting, pain level, respiratory function and temperature     History of difficult intubation: no   Immediate pre-procedure details:    Reassessment: Patient reassessed immediately prior to procedure     Reviewed: vital signs, relevant labs/tests and NPO status     Verified: bag valve mask available, emergency equipment available, intubation equipment available, IV patency confirmed and oxygen available   Procedure details (see MAR for exact dosages):    Preoxygenation:  Nasal cannula   Sedation:  Etomidate   Intra-procedure monitoring:  Blood pressure monitoring, cardiac monitor, continuous pulse oximetry, continuous capnometry, frequent LOC assessments and frequent vital sign checks   Intra-procedure events: none     Total sedation time (minutes):  20 Post-procedure details:    Attendance: Constant attendance by certified staff until patient recovered  Recovery: Patient returned to pre-procedure baseline     Estimated blood loss (see I/O flowsheets): no     Post-sedation assessments completed and reviewed: airway patency, cardiovascular function, hydration status, mental status, pain level, respiratory function and temperature     Specimens recovered:  None   Patient is stable for discharge or admission: yes     Patient tolerance:  Tolerated well, no immediate complications   (including critical care time)  Medications Ordered in ED Medications  sodium chloride 0.9 % bolus 1,000 mL (0 mLs Intravenous Stopped 05/31/16 1235)  etomidate (AMIDATE) injection 10 mg (10 mg Intravenous Given 05/31/16 1110)  rivaroxaban (XARELTO) tablet 20 mg (20 mg Oral Given 05/31/16 1354)     Initial Impression / Assessment and Plan / ED Course  I have reviewed the triage vital signs and the nursing notes.  Pertinent labs & imaging results that were available during my care of the patient were reviewed by me and considered in my medical decision making (see chart for  details).  CHA2Ds2-VASc Score for Atrial Fibrillation    Patient Score  Age <65 = 0 65-74 = 1 > 75 = 2 0  Sex Male = 0 Male = 1 0  CHF History No = 0  Yes = 1 0  HTN History No = 0  Yes = 1 0  Stroke/TIA/TE History No = 0  Yes = 1 0  Vascular Disease History No = 0  Yes = 1 0  Diabetes History No = 0  Yes = 1 0  Total:  0   0.2 % stroke rate/year from a score of 0   Clinical Course    40 yo M with no significant PMHx here with new onset AFib. Known onset of 2 hours prior to arrival. Pt with intermittent RVR on monitor. EKG confirms AFib, no ST elevations. No known recent triggers - no recent infectious sx, no h/o same, no family history, no apparent ECG interval abnormalities. Management discussed with pt. Given acute onset with known onset 2 hours ago, no other risk factors, decision made to undergo cardioversion. Pt tolerated procedure very well with return to NSR. He recovered completely and was ambulatory throughout ED with normal rhythm, no further arrhythmia or tachycardia and repeat EKG shows NSR. Labs obtained and are unremarkable - no anemia, leukocytosis, lyte abnormality, and TSH is normal. Will d/c with Xarelto, outpt f/u in AFib clinic. Return precautions given. Pt counselled by myself and pharmacy re: anticoagulant use.  Final Clinical Impressions(s) / ED Diagnoses   Final diagnoses:  New onset atrial fibrillation [facility])    New Prescriptions Discharge Medication List as of 05/31/2016  1:21 PM    START taking these medications   Details  rivaroxaban (XARELTO) 20 MG TABS tablet Take 1 tablet (20 mg total) by mouth daily with supper., Starting Mon 05/31/2016, Print         Duffy Bruce, MD 06/01/16 [phone]

## 2016-06-02 ENCOUNTER — Ambulatory Visit (HOSPITAL_COMMUNITY)
Admission: RE | Admit: 2016-06-02 | Discharge: 2016-06-02 | Disposition: A | Discharge status: Home / Self Care | Payer: Managed Care, Other (non HMO) | Source: Ambulatory Visit | Attending: Nurse Practitioner | Admitting: Nurse Practitioner

## 2016-06-02 ENCOUNTER — Encounter (HOSPITAL_COMMUNITY): Payer: Self-pay | Admitting: Nurse Practitioner

## 2016-06-02 VITALS — BP 150/88 | HR 84 | Ht 76.0 in | Wt 194.6 lb

## 2016-06-02 DIAGNOSIS — Z7901 Long term (current) use of anticoagulants: Secondary | ICD-10-CM | POA: Diagnosis not present

## 2016-06-02 DIAGNOSIS — J45909 Unspecified asthma, uncomplicated: Secondary | ICD-10-CM | POA: Diagnosis not present

## 2016-06-02 DIAGNOSIS — I480 Paroxysmal atrial fibrillation: Secondary | ICD-10-CM | POA: Diagnosis not present

## 2016-06-02 NOTE — Progress Notes (Signed)
Primary Care Physician: Junie Panning, DO Referring Physician: [facility] ER   Juan Rogers is a 40 y.o. male with a h/o acne, asthma, that was in the ER recently for symptomatic afib with RVR. 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 He started having chest tightness, diaphoresis, N/V and then was aware of irregular heart beat. He presented to the ER was cardioverted to SR.Labs unremarkable. TSH/Troponins negative. He has a chadsvasc score of 0, was given xarelto in the  ER but just picked up from the drug store today and plans to start tonight. He mentioned he had some aching in hs chest and left arm last pm, kept him from sleeping well. Has not noticed any this am.  Lifestyle issues reviewed with pt and he denies any alcohol use, excessive caffeine use, snoring, tobacco, positive for  stress from a 40 yo teenage son, normal weight.Wil have some asthma symptoms if very exertional but mostly has grown out of it.  Today, he denies symptoms of palpitations,  shortness of breath, orthopnea, PND, lower extremity edema, dizziness, presyncope, syncope, or neurologic sequela.Positive for recent palpitations, and chest discomfort.  Past Medical History:  Diagnosis Date  . Acne   . Asthma    No past surgical history on file.  Current Outpatient Prescriptions  Medication Sig Dispense Refill  . acetaminophen (TYLENOL 8 HOUR) 650 MG CR tablet Take 1 tablet (650 mg total) by mouth every 8 (eight) hours as needed for pain. 30 tablet 0  . rivaroxaban (XARELTO) 20 MG TABS tablet Take 1 tablet (20 mg total) by mouth daily with supper. 30 tablet 0  . tetracycline (ACHROMYCIN,SUMYCIN) 250 MG capsule TAKE 1 CAPSULE BY MOUTH TWICE DAILY (Patient not taking: Reported on 06/02/2016) 60 capsule 0   No current facility-administered medications for this encounter.     No Known Allergies  Social History   Social History  . Marital status: Married   Spouse name: N/A  . Number of children: N/A  . Years of education: N/A   Occupational History  . Not on file.   Social History Main Topics  . Smoking status: Never Smoker  . Smokeless tobacco: Never Used  . Alcohol use No     Comment: Denies alcohol use.  . Drug use: No     Comment: Denies recreational drug use.  Marland Kitchen Sexual activity: Not on file   Other Topics Concern  . Not on file   Social History Narrative   Works for [employer]. Married.    Family History  Problem Relation Age of Onset  . Asthma Mother     ROS- All systems are reviewed and negative except as per the HPI above  Physical Exam: Vitals:   06/02/16 1449  BP: (!) 150/88  Pulse: 84  Weight: 194 lb 9.6 oz (88.3 kg)  Height: 6\' 4"  (1.93 m)    GEN- The patient is well appearing, alert and oriented x 3 today.   Head- normocephalic, atraumatic Eyes-  Sclera clear, conjunctiva pink Ears- hearing intact Oropharynx- clear Neck- supple, no JVP Lymph- no cervical lymphadenopathy Lungs- Clear to ausculation bilaterally, normal work of breathing Heart- Regular rate and rhythm, no murmurs, rubs or gallops, PMI not laterally displaced GI- soft, NT, ND, + BS Extremities- no clubbing, cyanosis, or edema MS- no significant deformity or atrophy Skin- no rash or lesion Psych- euthymic mood, full affect Neuro- strength and sensation are intact  EKG- NSR, normal EKG, PR int 132 ms, qrs int 86 ms, qtc 411 ms Epic records reviewed  Assessment and Plan: 1.PAF S/p cardioversion This patients CHA2DS2-VASc Score is 0 but will require to take DOAC for 30 days post cardioversion and stressed to pt to start tonight. No lifestyle triggers identified Echo to look for underlying reasons for afib Stress Myoview to assess for ischemia with recent chest pain with exertional activity associated with n/v, diaphoresis and afib  F/u in afib clinic in two weeks

## 2016-06-03 ENCOUNTER — Encounter (HOSPITAL_COMMUNITY): Payer: Self-pay | Admitting: Nurse Practitioner

## 2016-06-03 ENCOUNTER — Ambulatory Visit (HOSPITAL_COMMUNITY)
Admission: RE | Admit: 2016-06-03 | Discharge: 2016-06-03 | Disposition: A | Discharge status: Home / Self Care | Payer: Managed Care, Other (non HMO) | Source: Ambulatory Visit | Attending: Nurse Practitioner | Admitting: Nurse Practitioner

## 2016-06-03 ENCOUNTER — Telehealth (HOSPITAL_BASED_OUTPATIENT_CLINIC_OR_DEPARTMENT_OTHER): Payer: Self-pay | Admitting: Emergency Medicine

## 2016-06-03 DIAGNOSIS — R079 Chest pain, unspecified: Secondary | ICD-10-CM

## 2016-06-03 DIAGNOSIS — L709 Acne, unspecified: Secondary | ICD-10-CM | POA: Insufficient documentation

## 2016-06-03 DIAGNOSIS — R0789 Other chest pain: Secondary | ICD-10-CM | POA: Insufficient documentation

## 2016-06-03 DIAGNOSIS — I480 Paroxysmal atrial fibrillation: Secondary | ICD-10-CM | POA: Diagnosis present

## 2016-06-03 DIAGNOSIS — Z7901 Long term (current) use of anticoagulants: Secondary | ICD-10-CM | POA: Diagnosis not present

## 2016-06-03 DIAGNOSIS — J45909 Unspecified asthma, uncomplicated: Secondary | ICD-10-CM | POA: Insufficient documentation

## 2016-06-04 ENCOUNTER — Telehealth (HOSPITAL_COMMUNITY): Payer: Self-pay | Admitting: *Deleted

## 2016-06-04 NOTE — Telephone Encounter (Signed)
Left message on voicemail in reference to upcoming appointment scheduled for 06/08/16. Phone number given for a call back so details instructions can be given. Juan Rogers

## 2016-06-04 NOTE — Progress Notes (Signed)
Primary Care Physician: Junie Panning, DO Referring Physician: [facility] ER   Juan Rogers is a 40 y.o. male with a h/o acne, asthma, that was in the ER recently for symptomatic afib with RVR. 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 He started having chest tightness, diaphoresis, N/V and then was aware of irregular heart beat. He presented to the ER was cardioverted to SR.Labs unremarkable. TSH/Troponins negative. He has a chadsvasc score of 0, was given xarelto in the  ER but just picked up from the drug store today and plans to start tonight. He mentioned he had some aching in hs chest and left arm last pm, kept him from sleeping well. Has not noticed any this am.  Lifestyle issues reviewed with pt and he denies any alcohol use, excessive caffeine use, snoring, tobacco, positive for  stress from a 59 yo teenage son, normal weight.Wil have some asthma symptoms if very exertional but mostly has grown out of it.  Pt asked to be seen 9/21 for shortness of breath walking up an incline at work associated with some chest tightness. It resolved within a few minutes. His work sent him home. In the office, he is complaining of chest tightness most of the time since ER visit and cardioversion. He also says that he hears a gurgling in his left upper chest. He was walked for about 5 mins in the office without desaturation, inappropriate tachycardia, or unusual short ness of breath. He is tender on palpation left upper chest. Sent for CXR and negative. Will move up stress test.He is in no distress currently. EKG NSR, normal EKG.  Today, he denies symptoms of palpitations,  shortness of breath, orthopnea, PND, lower extremity edema, dizziness, presyncope, syncope, or neurologic sequela.Positive for recent palpitations, and chest discomfort.  Past Medical History:  Diagnosis Date  . Acne   . Asthma    No past surgical history on file.  Current  Outpatient Prescriptions  Medication Sig Dispense Refill  . acetaminophen (TYLENOL 8 HOUR) 650 MG CR tablet Take 1 tablet (650 mg total) by mouth every 8 (eight) hours as needed for pain. 30 tablet 0  . rivaroxaban (XARELTO) 20 MG TABS tablet Take 1 tablet (20 mg total) by mouth daily with supper. 30 tablet 0  . tetracycline (ACHROMYCIN,SUMYCIN) 250 MG capsule TAKE 1 CAPSULE BY MOUTH TWICE DAILY (Patient not taking: Reported on 06/03/2016) 60 capsule 0   No current facility-administered medications for this encounter.     No Known Allergies  Social History   Social History  . Marital status: Married    Spouse name: N/A  . Number of children: N/A  . Years of education: N/A   Occupational History  . Not on file.   Social History Main Topics  . Smoking status: Never Smoker  . Smokeless tobacco: Never Used  . Alcohol use No     Comment: Denies alcohol use.  . Drug use: No     Comment: Denies recreational drug use.  Marland Kitchen Sexual activity: Not on file   Other Topics Concern  . Not on file   Social History Narrative   Works for [employer]. Married.    Family History  Problem Relation Age of Onset  . Asthma Mother     ROS- All systems are reviewed and negative except as per the HPI above  Physical Exam: Vitals:   06/03/16 1419  BP: 120/84  Pulse: 86  SpO2: 97%  Weight: 193 lb (87.5 kg)  Height: 6\' 4"  (1.93 m)    GEN- The patient is well appearing, alert and oriented x 3 today.   Head- normocephalic, atraumatic Eyes-  Sclera clear, conjunctiva pink Ears- hearing intact Oropharynx- clear Neck- supple, no JVP Lymph- no cervical lymphadenopathy Lungs- Clear to ausculation bilaterally, normal work of breathing Heart- Regular rate and rhythm, no murmurs, rubs or gallops, PMI not laterally displaced. Tender on palpation left upper chest GI- soft, NT, ND, + BS Extremities- no clubbing, cyanosis, or edema MS- no significant deformity or atrophy Skin- no rash or  lesion Psych- euthymic mood, full affect Neuro- strength and sensation are intact  EKG- NSR, normal EKG, PR int 132 ms, qrs int 86 ms, qtc 411 ms Epic records reviewed CXR-FINDINGS:9/21- Normal heart size and mediastinal contours. No acute infiltrate or edema. No effusion or pneumothorax. No acute osseous findings.  IMPRESSION: No active cardiopulmonary disease.  Assessment and Plan: 1.PAF S/p cardioversion This patients CHA2DS2-VASc Score is 0 but will require to take DOAC for 30 days post cardioversion and stressed to pt to start tonight. No lifestyle triggers identified Echo to look for underlying reasons for afib Will move up Stress Myoview to Tuesday 9/26, to assess for ischemia with recent chest pain/shortness of breath with exertional activity   2.Chest wall pain, reproducible on palpation  possibly related to musculoskeletal issues, since he did help push a stalled car at the dealership, vomited after that or residual soreness from the cardioversion. Can take tylenol as prescribed on label   F/u in afib clinic in two weeks  Butch Penny C. Rollyn Scialdone, [facility] [address] [place], [place] [id number] [phone]

## 2016-06-08 ENCOUNTER — Ambulatory Visit (HOSPITAL_BASED_OUTPATIENT_CLINIC_OR_DEPARTMENT_OTHER): Payer: Managed Care, Other (non HMO)

## 2016-06-08 ENCOUNTER — Other Ambulatory Visit: Payer: Self-pay

## 2016-06-08 ENCOUNTER — Ambulatory Visit (HOSPITAL_COMMUNITY): Payer: Managed Care, Other (non HMO) | Attending: Cardiology

## 2016-06-08 DIAGNOSIS — I480 Paroxysmal atrial fibrillation: Secondary | ICD-10-CM | POA: Diagnosis not present

## 2016-06-08 LAB — MYOCARDIAL PERFUSION IMAGING
CHL CUP MPHR: 182 {beats}/min
CHL CUP NUCLEAR SDS: 0
CHL CUP NUCLEAR SRS: 0
CSEPHR: 89 %
CSEPPHR: 162 {beats}/min
Estimated workload: 8.5 METS
Exercise duration (min): 7 min
Exercise duration (sec): 0 s
LHR: 0.37
LV dias vol: 95 mL (ref 62–150)
LVSYSVOL: 46 mL
NUC STRESS TID: 1.03
Rest HR: 64 {beats}/min
SSS: 0

## 2016-06-08 LAB — ECHOCARDIOGRAM COMPLETE
HEIGHTINCHES: 76 in
WEIGHTICAEL: 3088 [oz_av]

## 2016-06-08 MED ORDER — TECHNETIUM TC 99M TETROFOSMIN IV KIT
10.4000 | PACK | Freq: Once | INTRAVENOUS | Status: AC | PRN
  Administered 2016-06-08: 10 via INTRAVENOUS
  Filled 2016-06-08: qty 10

## 2016-06-08 MED ORDER — TECHNETIUM TC 99M TETROFOSMIN IV KIT
33.0000 | PACK | Freq: Once | INTRAVENOUS | Status: AC | PRN
  Administered 2016-06-08: 33 via INTRAVENOUS
  Filled 2016-06-08: qty 33

## 2016-06-21 ENCOUNTER — Other Ambulatory Visit (HOSPITAL_COMMUNITY): Payer: BLUE CROSS/BLUE SHIELD

## 2016-06-21 ENCOUNTER — Encounter (HOSPITAL_COMMUNITY): Payer: BLUE CROSS/BLUE SHIELD

## 2016-07-01 ENCOUNTER — Encounter (HOSPITAL_COMMUNITY): Payer: Self-pay | Admitting: Nurse Practitioner

## 2016-07-01 ENCOUNTER — Ambulatory Visit (HOSPITAL_COMMUNITY)
Admission: RE | Admit: 2016-07-01 | Discharge: 2016-07-01 | Disposition: A | Discharge status: Home / Self Care | Payer: Managed Care, Other (non HMO) | Source: Ambulatory Visit | Attending: Nurse Practitioner | Admitting: Nurse Practitioner

## 2016-07-01 VITALS — BP 124/92 | HR 89 | Ht 76.0 in | Wt 192.2 lb

## 2016-07-01 DIAGNOSIS — J45909 Unspecified asthma, uncomplicated: Secondary | ICD-10-CM | POA: Diagnosis not present

## 2016-07-01 DIAGNOSIS — I480 Paroxysmal atrial fibrillation: Secondary | ICD-10-CM

## 2016-07-01 DIAGNOSIS — Z79899 Other long term (current) drug therapy: Secondary | ICD-10-CM | POA: Diagnosis not present

## 2016-07-01 DIAGNOSIS — Z7901 Long term (current) use of anticoagulants: Secondary | ICD-10-CM | POA: Insufficient documentation

## 2016-07-01 NOTE — Progress Notes (Signed)
Primary Care Physician: Junie Panning, DO Referring Physician: ER f/u   Juan Rogers is a 40 y.o. male with a h/o PAF x one episode that started while pushing a stalled car, seen in ER and will afib less than 2 hour onset and was successfully cardioverted. He was d/c on xarelto x 30 days. On f/u echo was done and because of his ongoing c/o of chest pain after procedure, CXR and stress test were done. All tests WNL.  He is here for f/u, one month from ER visit. He will soon finish up xarelto and stop drug. He still has chest pressure, intermittent x 30 secs at a time, sounds non significant. He got his lipids checked at work and LDL was 123, HDL 38 and trigs 258. He was encouraged to limit sugar as well as white carbohydrates in his diet. He was also encouraged to establish with a PCP.  Today, he denies symptoms of palpitations, chest pain, shortness of breath, orthopnea, PND, lower extremity edema, dizziness, presyncope, syncope, or neurologic sequela. The patient is tolerating medications without difficulties and is otherwise without complaint today.   Past Medical History:  Diagnosis Date  . Acne   . Asthma    No past surgical history on file.  Current Outpatient Prescriptions  Medication Sig Dispense Refill  . acetaminophen (TYLENOL 8 HOUR) 650 MG CR tablet Take 1 tablet (650 mg total) by mouth every 8 (eight) hours as needed for pain. 30 tablet 0  . rivaroxaban (XARELTO) 20 MG TABS tablet Take 1 tablet (20 mg total) by mouth daily with supper. 30 tablet 0   No current facility-administered medications for this encounter.     No Known Allergies  Social History   Social History  . Marital status: Married    Spouse name: N/A  . Number of children: N/A  . Years of education: N/A   Occupational History  . Not on file.   Social History Main Topics  . Smoking status: Never Smoker  . Smokeless tobacco: Never Used  . Alcohol use No     Comment: Denies alcohol use.  .  Drug use: No     Comment: Denies recreational drug use.  Marland Kitchen Sexual activity: Not on file   Other Topics Concern  . Not on file   Social History Narrative   Works for [employer]. Married.    Family History  Problem Relation Age of Onset  . Asthma Mother     ROS- All systems are reviewed and negative except as per the HPI above  Physical Exam: Vitals:   07/01/16 0835  BP: (!) 124/92  Pulse: 89  Weight: 192 lb 3.2 oz (87.2 kg)  Height: 6\' 4"  (1.93 m)    GEN- The patient is well appearing, alert and oriented x 3 today.   Head- normocephalic, atraumatic Eyes-  Sclera clear, conjunctiva pink Ears- hearing intact Oropharynx- clear Neck- supple, no JVP Lymph- no cervical lymphadenopathy Lungs- Clear to ausculation bilaterally, normal work of breathing Heart- Regular rate and rhythm, no murmurs, rubs or gallops, PMI not laterally displaced GI- soft, NT, ND, + BS Extremities- no clubbing, cyanosis, or edema MS- no significant deformity or atrophy Skin- no rash or lesion Psych- euthymic mood, full affect Neuro- strength and sensation are intact  EKG- NSR at 89 bpm, pr int 128 ms, qrs int 72 ms, qtc 418 ms Echo-Study Conclusions  - Left ventricle: The cavity size was normal. Systolic function was   normal.  The estimated ejection fraction was in the range of 55%   to 60%. Wall motion was normal; there were no regional wall   motion abnormalities. Left ventricular diastolic function   parameters were normal. - Aortic valve: Trileaflet; normal thickness leaflets. There was no   regurgitation. - Aortic root: The aortic root was normal in size. - Mitral valve: Structurally normal valve. There was no   regurgitation. - Left atrium: The atrium was normal in size. - Right ventricle: The cavity size was normal. Wall thickness was   normal. Systolic function was normal. - Right atrium: The atrium was normal in size. - Tricuspid valve: There was no regurgitation. -  Pulmonary arteries: Systolic pressure was within the normal   range. - Inferior vena cava: The vessel was normal in size. - Pericardium, extracardiac: There was no pericardial effusion.  Impressions:  - Normal study.    CXR-Nuclear stress EF: 51%.  There was no ST segment deviation noted during stress.  This is a low risk study.  The left ventricular ejection fraction is mildly decreased (45-54%).   1. EF 51%, mild diffuse hypokinesis.   2. Normal perfusion images, no evidence for ischemia or infarction.  3. Normal stress ECG.  4. Mild chest pain during exercise.   Low risk study.  Would consider echo to confirm mildly decreased EF.   CXR-FINDINGS: Normal heart size and mediastinal contours. No acute infiltrate or edema. No effusion or pneumothorax. No acute osseous findings.  IMPRESSION: No active cardiopulmonary disease.    Assessment and Plan: 1. Paroxymal atrial fib x one episode Resolved with cardioversion 10/18 Can stop xarelto when thru with 30 day supply with chadsvasc score of 0 Reassured that all tests look normal and chest pressure sounds insignificant  2. Abnormal lipid profile Mostly elevated trigs Encouraged to avoid carbohydrates, sugars  Encouraged to establish with a PCP afib clinic as needed   Butch Penny C. Juwaun Inskeep, [facility] [address] [place], [place] [address] [phone]

## 2016-08-17 ENCOUNTER — Other Ambulatory Visit: Payer: Self-pay | Admitting: Family Medicine

## 2016-08-17 NOTE — Telephone Encounter (Signed)
Needs refill on tricyclycline. [facility] [address]. please let pt know if it can be refilled

## 2016-08-17 NOTE — Telephone Encounter (Signed)
Pt needs a refill on Xarelto. Pt uses [facility] on [address]. Please advise. Thanks! ep

## 2016-08-23 ENCOUNTER — Ambulatory Visit: Payer: Managed Care, Other (non HMO) | Admitting: Family Medicine

## 2016-08-27 ENCOUNTER — Ambulatory Visit: Payer: Managed Care, Other (non HMO) | Admitting: Family Medicine

## 2016-08-30 ENCOUNTER — Encounter: Payer: Self-pay | Admitting: Family Medicine

## 2016-08-30 ENCOUNTER — Ambulatory Visit (INDEPENDENT_AMBULATORY_CARE_PROVIDER_SITE_OTHER): Payer: Managed Care, Other (non HMO) | Admitting: Family Medicine

## 2016-08-30 VITALS — BP 120/82 | HR 77 | Temp 98.2°F | Ht 76.0 in | Wt 192.2 lb

## 2016-08-30 DIAGNOSIS — L700 Acne vulgaris: Secondary | ICD-10-CM | POA: Diagnosis not present

## 2016-08-30 MED ORDER — TETRACYCLINE HCL 250 MG PO CAPS: 250.0000 mg | ORAL_CAPSULE | Freq: Two times a day (BID) | ORAL | 2 refills | Status: DC

## 2016-08-30 MED ORDER — TRETINOIN 0.025 % EX CREA: TOPICAL_CREAM | Freq: Every day | CUTANEOUS | 2 refills | Status: DC

## 2016-08-30 NOTE — Progress Notes (Signed)
    Subjective: CC: acne HPI: Juan Rogers is a 40 y.o. male presenting to clinic today for office visit. Concerns today include:  1. Acne Patient reports he used to be seen in [place] and was placed on Tetracycline 250mg  BID.  He notes that the last time he took Tetracycline about 8 months ago.  He notes that he was on a prophylactic dose of this and took it daily.  He notes that he sees [facility] for a removal and inquired about scar reduction.  He is not ready to pursue that yet.  He reports that he has used several OTC washes and has used Benzaclin gel but that his skin got really red and irritated with that.  He notes that "bad skin runs" in his family.  Social History Reviewed. FamHx and MedHx reviewed.  Please see EMR. ROS: Per HPI  Objective: Office vital signs reviewed. BP 120/82   Pulse 77   Temp 98.2 F (36.8 C) (Oral)   Ht 6\' 4"  (1.93 m)   Wt 192 lb 3.2 oz (87.2 kg)   SpO2 98%   BMI 23.40 kg/m   Physical Examination:  General: Awake, alert, well nourished, No acute distress Skin: dry; moderate scarring over the face.  Some open and closed comedones appreciated along jaw and chest.  Assessment/ Plan: 40 y.o. male   1. Acne vulgaris.  Skin care discussed.  I wonder if patient would not be a good candidate for Accutane given amount of scarring.  Though on exam, no obvious cystic lesions.  - tetracycline (ACHROMYCIN,SUMYCIN) 250 MG capsule; Take 1 capsule (250 mg total) by mouth 2 (two) times daily before a meal.  Dispense: 60 capsule; Refill: 2 - tretinoin (RETIN-A) 0.025 % cream; Apply topically at bedtime.  Dispense: 45 g; Refill: 2 - Advised to start with applying cream once every 3 days as tolerated.  May increase to every other day then daily if tolerates. - Reviewed that Tetracycline not intended for long term use.  Will treat for 3 months then ask that he follow up with PCP for further evaluation.  May need referral to Derm for skin.   Janora Norlander, DO PGY-3, [facility]

## 2016-08-30 NOTE — Patient Instructions (Signed)
You should expect some irritation associated with the Retin-A cream.  I want you to only apply this every 3 days (twice weekly) to start.  If you are able to tolerate this frequency, you can increase to every other day and then eventually to daily.  Plan to follow up with Dr Gerlean Ren in 3 months for follow up on acne.  If you are still having flares, she may consider referring you back to Dermatology for possible Accutane initiation.  Acne Acne is a skin problem that causes pimples. Acne occurs when the pores in the skin get blocked. The pores may become infected with bacteria, or they may become red, sore, and swollen. Acne is a common skin problem, especially for teenagers. Acne usually goes away over time. What are the causes? Each pore contains an oil gland. Oil glands make an oily substance that is called sebum. Acne happens when these glands get plugged with sebum, dead skin cells, and dirt. Then, the bacteria that are normally found in the oil glands multiply and cause inflammation. Acne is commonly triggered by changes in your hormones. These hormonal changes can cause the oil glands to get bigger and to make more sebum. Factors that can make acne worse include:  Hormone changes during:  Adolescence.  Women's menstrual cycles.  Pregnancy.  Oil-based cosmetics and hair products.  Harshly scrubbing the skin.  Strong soaps.  Stress.  Hormone problems that are due to certain diseases.  Long or oily hair rubbing against the skin.  Certain medicines.  Pressure from headbands, backpacks, or shoulder pads.  Exposure to certain oils and chemicals. What increases the risk? This condition is more likely to develop in:  Teenagers.  People who have a family history of acne. What are the signs or symptoms? Acne often occurs on the face, neck, chest, and upper back. Symptoms include:  Small, red bumps (pimples or papules).  Whiteheads.  Blackheads.  Small, pus-filled pimples  (pustules).  Big, red pimples or pustules that feel tender. More severe acne can cause:  An infected area that contains a collection of pus (abscess).  Hard, painful, fluid-filled sacs (cysts).  Scars. How is this diagnosed? This condition is diagnosed with a medical history and physical exam. Blood tests may also be done. How is this treated? Treatment for this condition can vary depending on the severity of your acne. Treatment may include:  Creams and lotions that prevent oil glands from clogging.  Creams and lotions that treat or prevent infections and inflammation.  Antibiotic medicines that are applied to the skin or taken as a pill.  Pills that decrease sebum production.  Birth control pills.  Light or laser treatments.  Surgery.  Injections of medicine into the affected areas.  Chemicals that cause peeling of the skin. Your health care provider will also recommend the best way to take care of your skin. Good skin care is the most important part of treatment. Follow these instructions at home: Skin care Take care of your skin as told by your health care provider. You may be told to do these things:  Wash your skin gently at least two times each day, as well as:  After you exercise.  Before you go to bed.  Use mild soap.  Apply a water-based skin moisturizer after you wash your skin.  Use a sunscreen or sunblock with SPF 30 or greater. This is especially important if you are using acne medicines.  Choose cosmetics that will not plug your oil glands (  are noncomedogenic). Medicines  Take over-the-counter and prescription medicines only as told by your health care provider.  If you were prescribed an antibiotic medicine, apply or take it as told by your health care provider. Do not stop taking the antibiotic even if your condition improves. General instructions  Keep your hair clean and off of your face. If you have oily hair, shampoo your hair regularly or  daily.  Avoid leaning your chin or forehead against your hands.  Avoid wearing tight headbands or hats.  Avoid picking or squeezing your pimples. That can make your acne worse and cause scarring.  Keep all follow-up visits as told by your health care provider. This is important.  Shave gently and only when necessary.  Keep a food journal to figure out if any foods are linked with your acne. Contact a health care provider if:  Your acne is not better after eight weeks.  Your acne gets worse.  You have a large area of skin that is red or tender.  You think that you are having side effects from any acne medicine. This information is not intended to replace advice given to you by your health care provider. Make sure you discuss any questions you have with your health care provider. Document Released: 08/27/2000 Document Revised: 04/30/2016 Document Reviewed: 11/06/2014 Elsevier Interactive Patient Education  2017 [employer].

## 2016-08-31 ENCOUNTER — Other Ambulatory Visit: Payer: Self-pay | Admitting: Family Medicine

## 2016-09-21 ENCOUNTER — Ambulatory Visit (INDEPENDENT_AMBULATORY_CARE_PROVIDER_SITE_OTHER): Payer: Managed Care, Other (non HMO) | Admitting: Obstetrics and Gynecology

## 2016-09-21 ENCOUNTER — Encounter: Payer: Self-pay | Admitting: Obstetrics and Gynecology

## 2016-09-21 VITALS — BP 100/60 | HR 65 | Temp 97.9°F | Wt 189.0 lb

## 2016-09-21 DIAGNOSIS — A084 Viral intestinal infection, unspecified: Secondary | ICD-10-CM | POA: Diagnosis not present

## 2016-09-21 MED ORDER — ONDANSETRON 4 MG PO TBDP: 4.0000 mg | ORAL_TABLET | ORAL | 0 refills | Status: DC | PRN

## 2016-09-21 NOTE — Progress Notes (Deleted)
Throwing up since Sunday afternoon Tried pepto and didn't help hasbnt been able to go to work Diarrhea started sme day hasnt eaten sicne Sunday No blood Able to keep some fluids but dont want to aggrevate Son had for a week, and wife with similar Fever Mondaymorning 101.3 none since No new exposures  Hot dogs Sunday  VOMITING  Vomiting began *** days ago. Progression: *** Number of times vomited in last day: *** Medications tried: *** Recent travel: *** Recent sick contacts: *** Ingested suspicious foods: *** Immunocompromised: ***  Symptoms Diarrhea: *** Abdominal pain: no Blood in vomit: *** Weight loss: *** Decreased urine output:*** Lightheadedness: *** Fever: *** Bloody stools: ***  ROS see HPI Smoking Status noted

## 2016-09-21 NOTE — Progress Notes (Signed)
   Subjective:   Patient ID: Juan Rogers, male    DOB: 04-Dec-1975, 41 y.o.   MRN: [id number]  Patient presents for Same Day Appointment  Chief Complaint  Patient presents with  . Nausea    vomiting and diarrhea     HPI: # VOMITING Emesis since Sunday afternoon Tried pepto and didn't help hasnt been able to go to work the last two days due to illness Diarrhea started same day hasnt eaten since Sunday but able to keep some fluids down No blood in stool or emesis Son had similar infection for a week and wife also Fever Monday morning 101.38F none since No new exposures Recent travel: no Ingested suspicious foods: no  Symptoms Abdominal pain: no Decreased urine output: no Lightheadedness: no  Patient denies any alcohol use.   Review of Systems   See HPI for ROS.   History  Smoking Status  . Never Smoker  Smokeless Tobacco  . Never Used    Past medical history, surgical, family, and social history reviewed and updated in the EMR as appropriate.  Objective:  BP 100/60   Pulse 65   Temp 97.9 F (36.6 C) (Oral)   Wt 189 lb (85.7 kg)   SpO2 99%   BMI 23.01 kg/m  Vitals and nursing note reviewed  Physical Exam  Constitutional: He is well-developed, well-nourished, and in no distress.  HENT:  Mouth/Throat: Oropharynx is clear and moist and mucous membranes are normal.  Cardiovascular: Normal rate, regular rhythm and normal heart sounds.   Pulmonary/Chest: Effort normal and breath sounds normal.  Abdominal: Soft. Normal appearance and bowel sounds are normal. He exhibits no distension. There is tenderness in the right lower quadrant and left lower quadrant. There is no rebound and no guarding.  Skin: Skin is warm and dry. No rash noted.    Assessment & Plan:  1. Viral gastroenteritis Symptoms consistent with viral gastritis is acute onset and sick contacts. Will treat with conservative measures to include by mouth hydration and anti-emetic. No red flags on  exam. Patient is well-appearing. Work note given.    PATIENT EDUCATION PROVIDED: See AVS   Luiz Blare, DO 09/21/2016, 10:06 AM PGY-3, [place]

## 2016-09-21 NOTE — Patient Instructions (Signed)
Viral Gastroenteritis, Adult Introduction Viral gastroenteritis is also known as the stomach flu. This condition is caused by certain germs (viruses). These germs can be passed from person to person very easily (are very contagious). This condition can cause sudden watery poop (diarrhea), fever, and throwing up (vomiting). Having watery poop and throwing up can make you feel weak and cause you to get dehydrated. Dehydration can make you tired and thirsty, make you have a dry mouth, and make it so you pee (urinate) less often. Older adults and people with other diseases or a weak defense system (immune system) are at higher risk for dehydration. It is important to replace the fluids that you lose from having watery poop and throwing up. Follow these instructions at home: Follow instructions from your doctor about how to care for yourself at home. Eating and drinking Follow these instructions as told by your doctor:  Take an oral rehydration solution (ORS). This is a drink that is sold at pharmacies and stores.  Drink clear fluids in small amounts as you are able, such as:  Water.  Ice chips.  Diluted fruit juice.  Low-calorie sports drinks.  Eat bland, easy-to-digest foods in small amounts as you are able, such as:  Bananas.  Applesauce.  Rice.  Low-fat (lean) meats.  Toast.  Crackers.  Avoid fluids that have a lot of sugar or caffeine in them.  Avoid alcohol.  Avoid spicy or fatty foods. General instructions  Drink enough fluid to keep your pee (urine) clear or pale yellow.  Wash your hands often. If you cannot use soap and water, use hand sanitizer.  Make sure that all people in your home wash their hands well and often.  Rest at home while you get better.  Take over-the-counter and prescription medicines only as told by your doctor.  Watch your condition for any changes.  Take a warm bath to help with any burning or pain from having watery poop.  Keep all  follow-up visits as told by your doctor. This is important. Contact a doctor if:  You cannot keep fluids down.  Your symptoms get worse.  You have new symptoms.  You feel light-headed or dizzy.  You have muscle cramps. Get help right away if:  You have chest pain.  You feel very weak or you pass out (faint).  You see blood in your throw-up.  Your throw-up looks like coffee grounds.  You have bloody or black poop (stools) or poop that look like tar.  You have a very bad headache, a stiff neck, or both.  You have a rash.  You have very bad pain, cramping, or bloating in your belly (abdomen).  You have trouble breathing.  You are breathing very quickly.  Your heart is beating very quickly.  Your skin feels cold and clammy.  You feel confused.  You have pain when you pee.  You have signs of dehydration, such as:  Dark pee, hardly any pee, or no pee.  Cracked lips.  Dry mouth.  Sunken eyes.  Sleepiness.  Weakness. This information is not intended to replace advice given to you by your health care provider. Make sure you discuss any questions you have with your health care provider. Document Released: 02/16/2008 Document Revised: 03/19/2016 Document Reviewed: 05/06/2015  2017 Elsevier  

## 2016-12-31 ENCOUNTER — Other Ambulatory Visit: Payer: Self-pay | Admitting: Family Medicine

## 2016-12-31 DIAGNOSIS — L700 Acne vulgaris: Secondary | ICD-10-CM

## 2017-01-11 ENCOUNTER — Encounter: Payer: Self-pay | Admitting: Family Medicine

## 2017-01-11 ENCOUNTER — Ambulatory Visit (INDEPENDENT_AMBULATORY_CARE_PROVIDER_SITE_OTHER): Payer: Managed Care, Other (non HMO) | Admitting: Family Medicine

## 2017-01-11 VITALS — BP 108/82 | HR 64 | Temp 97.7°F | Ht 76.0 in | Wt 199.6 lb

## 2017-01-11 DIAGNOSIS — N289 Disorder of kidney and ureter, unspecified: Secondary | ICD-10-CM

## 2017-01-11 DIAGNOSIS — L700 Acne vulgaris: Secondary | ICD-10-CM | POA: Diagnosis not present

## 2017-01-11 MED ORDER — TETRACYCLINE HCL 250 MG PO CAPS: 250.0000 mg | ORAL_CAPSULE | Freq: Two times a day (BID) | ORAL | 1 refills | Status: DC

## 2017-01-11 NOTE — Progress Notes (Signed)
Subjective:     Patient ID: Juan Rogers, male   DOB: [birth year], 41 y.o.   MRN: [id number]  HPI Juan Rogers is a 41yo male presenting today to discuss the progress of his acne. Also notes feelings of depression.  # Acne Vulgaris: Last office visit 08/2016 for this problem, at which time he was given prescriptions for Tetracycline and Tretinoin cream. He did not fill the prescription for the Tretinoin cream due to the cost. He has been taking Tetracycline as prescribed and has noted improvement in his symptoms. Recently seen by Dermatology two months ago, but they did not evaluate his acne--instead they removed a lesion from his face for pathology and are monitoring two more lesions for change. He is unsure what the pathologist report showed. Next follow up with Dermatology scheduled for June 2018. Requests refill of Tetracycline.  # Depression: Does not wish to discuss today. Reports he occasionally has thoughts of suicide, but does not have a plan and does not think he would follow through with this because he has a 65month old daughter. Does not wish to start medication or therapy. Reports he is depressed due to family stressors, but hopefully things will get better soon. Denies any active suicidal or homicidal thoughts today.  Also of note, reports shoulder pain. Plans to return for visit to discuss this.  Nonsmoker.  Review of Systems Per HPI    Objective:   Physical Exam  Constitutional: He appears well-developed and well-nourished. No distress.  Cardiovascular: Normal rate and regular rhythm.   No murmur heard. Pulmonary/Chest: Effort normal. No respiratory distress. He has no wheezes.  Abdominal: Soft. He exhibits no distension. There is no tenderness.  Skin:  Scarring noted on face with a few active comedones. Back clear from acne.  Psychiatric: He has a normal mood and affect. His behavior is normal.      Assessment and Plan:     1. Acne vulgaris Tetracycline refilled  until follow up with Dermatology. Will check CMP today to monitor renal and hepatic function on Tetracycline (note kidney function increasing and slightly abnormal in 05/2016). Follow up with Dermatology in 20months for further management.  2. Depression: PHQ9 score of 9. Refuses therapy and medication. Suicide hotline number given.

## 2017-01-11 NOTE — Patient Instructions (Signed)
Thank you so much for coming to visit today! I have refilled your Tetracycline for 2 months. Please follow up with Dermatology in June to determine if there is a better course of treatment for you. Please let us know if you would like to discuss your depression further. Suicide Prevention Hotline: [phone] We will check your kidney function today.  Please return at your convenience to discuss your shoulder pain.  Dr. Gerlean Ren

## 2017-01-12 LAB — CMP14+EGFR
A/G RATIO: 1.5 (ref 1.2–2.2)
ALK PHOS: 73 IU/L (ref 39–117)
ALT: 22 IU/L (ref 0–44)
AST: 26 IU/L (ref 0–40)
Albumin: 4.4 g/dL (ref 3.5–5.5)
BUN/Creatinine Ratio: 10 (ref 9–20)
BUN: 12 mg/dL (ref 6–20)
Bilirubin Total: <0.2 mg/dL (ref 0.0–1.2)
CALCIUM: 9.5 mg/dL (ref 8.7–10.2)
CO2: 21 mmol/L (ref 18–29)
Chloride: 103 mmol/L (ref 96–106)
Creatinine, Ser: 1.2 mg/dL (ref 0.76–1.27)
GFR calc Af Amer: 88 mL/min/{1.73_m2} (ref >59)
GFR, EST NON AFRICAN AMERICAN: 76 mL/min/{1.73_m2} (ref >59)
GLOBULIN, TOTAL: 2.9 g/dL (ref 1.5–4.5)
GLUCOSE: 108 mg/dL — AB (ref 65–99)
POTASSIUM: 5.1 mmol/L (ref 3.5–5.2)
SODIUM: 142 mmol/L (ref 134–144)
TOTAL PROTEIN: 7.3 g/dL (ref 6.0–8.5)

## 2017-01-13 ENCOUNTER — Telehealth: Payer: Self-pay | Admitting: Family Medicine

## 2017-01-13 NOTE — Telephone Encounter (Signed)
Pt would like someone to call him with lab results. ep

## 2017-01-17 ENCOUNTER — Encounter: Payer: Self-pay | Admitting: Family Medicine

## 2017-01-17 NOTE — Telephone Encounter (Signed)
LVM for pt to call the office. If pt calls, please give him the information below. Sharon T Saunders, CMA  

## 2017-01-17 NOTE — Telephone Encounter (Signed)
Letter mailed. Labs normal except for elevated blood sugar.

## 2017-01-19 ENCOUNTER — Encounter: Payer: Self-pay | Admitting: Family Medicine

## 2017-01-19 ENCOUNTER — Ambulatory Visit (INDEPENDENT_AMBULATORY_CARE_PROVIDER_SITE_OTHER): Payer: 59 | Admitting: Family Medicine

## 2017-01-19 VITALS — BP 104/78 | HR 73 | Temp 97.8°F | Wt 197.0 lb

## 2017-01-19 DIAGNOSIS — M67921 Unspecified disorder of synovium and tendon, right upper arm: Secondary | ICD-10-CM | POA: Diagnosis not present

## 2017-01-19 MED ORDER — NAPROXEN 500 MG PO TBEC: 500.0000 mg | DELAYED_RELEASE_TABLET | Freq: Two times a day (BID) | ORAL | 0 refills | Status: DC

## 2017-01-19 NOTE — Progress Notes (Signed)
    Subjective:  Juan Rogers is a 41 y.o. male who presents to the [facility] today with a chief complaint of R shoulder pain.   HPI:  R shoulder pain for the past 6 months. 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 Having difficulty moving arm at night and hurts to lay on. No popping, no trauma, no weakness, no numbness/tingling. Tried ibuprofen 800mg  a couple of times but felt it to be ineffective. Has tried his wife's percocet 7.5 occasionally which helps him get rest at night. No ice/rest. Would like a cortisone shot.  ROS: Per HPI  Objective:  Physical Exam: BP 104/78   Pulse 73   Temp 97.8 F (36.6 C) (Oral)   Wt 197 lb (89.4 kg)   SpO2 99%   BMI 23.98 kg/m   Gen: NAD, resting comfortably MSK: TTP over R bicipital groove, full active ROM+ Yergasons teston R. Skin: warm, dry Neuro: grossly normal, moves all extremities Psych: Normal affect and thought content    Assessment/Plan:  Biceps tendinopathy, right Discussed trying conservative management regular scheduled NSAIDs for 5-7 days and supportive care with rest and ice. Also recommended stretching and ROM exercises in the morning before he goes to work. If fails, discussed steroid injection in 1 week.   Bufford Lope, DO PGY-1, [facility] 01/19/2017 8:33 AM

## 2017-01-19 NOTE — Assessment & Plan Note (Signed)
Discussed trying conservative management regular scheduled NSAIDs for 5-7 days and supportive care with rest and ice. Also recommended stretching and ROM exercises in the morning before he goes to work. If fails, discussed steroid injection in 1 week.

## 2017-01-19 NOTE — Telephone Encounter (Signed)
Pt seeing Dr. Shawna Orleans today. Ottis Stain, CMA

## 2017-01-19 NOTE — Patient Instructions (Addendum)
It was good to meet you today.  For your biceps tendinopathy, - Please try regular NSAIDS (I have sent you in naproxen) for 5 to 7 days.  - Try ice, compression, and muscle rest. When your pain is under good control stretching and loosing up the area will also help  Please come back if not better after 1 week, we can try an injection at that time.  Take care and seek immediate care sooner if you develop any concerns.   Dr. Bufford Lope, DO [facility]   Biceps Tendon Tendinitis (Proximal) and Tenosynovitis The proximal biceps tendon is a strong cord of tissue that connects the biceps muscle, on the front of the upper arm, to the shoulder blade. Tendinitis is inflammation of a tendon. Tenosynovitis is inflammation of the lining around the tendon (tendon sheath). These conditions often occur at the same time, and they can interfere with the ability to bend the elbow and turn the hand palm-up (supination). Proximal biceps tendon tendinitis and tenosynovitis are usually caused by overusing the shoulder joint and the biceps muscle. These conditions usually heal within 6 weeks. Proximal biceps tendon tendinitis may include a grade 1 or grade 2 strain of the tendon. A grade 1 strain is mild, and it involves a slight pull of the tendon without any stretching or noticeable tearing of the tendon. There is usually no loss of biceps muscle strength. A grade 2 strain is moderate, and it involves a small tear in the tendon. The tendon is stretched, and biceps strength is usually decreased. What are the causes? This condition may be caused by:  A sudden increase in frequency or intensity of activity that involves the shoulder and the biceps muscle.  Overuse of the biceps muscle. This can happen when you do the same movements over and over, such as:  Supination.  Forceful straightening (hyperextension) of the elbow.  Bending the elbow.  A direct, forceful hit or injury (trauma) to the  elbow. This is rare. What increases the risk? The following factors may make you more likely to develop this condition:  Playing contact sports.  Playing sports that involve throwing and overhead movements, including racket sports, gymnastics, weight lifting, or bodybuilding.  Doing physical labor.  Having poor strength and flexibility of the arm and shoulder. What are the signs or symptoms? Symptoms of this condition may include:  Pain and inflammation in the front of the shoulder. Pain may get worse with movement, especially when you use resistance, as in weight lifting.  A feeling of warmth in the front of the shoulder.  Limited range of motion of the shoulder and the elbow.  A crackling sound (crepitation) when you move or touch the shoulder or the upper arm. In some cases, symptoms may return (recur) after treatment, and they may be long-lasting (chronic). How is this diagnosed? This condition is diagnosed based on your symptoms, your medical history, and a physical exam. You may have tests, including X-rays or MRIs. Your health care provider may test your range of motion by asking you to do arm movements. How is this treated? This condition is treated by resting and icing the injured area, and by doing physical therapy exercises. Depending on the severity of your condition, treatment may also include:  Medicines to help relieve pain and inflammation.  Ultrasound therapy. This is the application of sound waves to the injured area.  Injecting medicines (corticosteroids) into your tendon sheath.  Injecting medicines that numb the area (  local anesthetics).  Surgery to remove the damaged part of the tendon and reattach the undamaged part of the tendon to the arm bone (humerus). This is usually only done if you have symptoms that do not get better with other treatment methods. Follow these instructions at home: Managing pain, stiffness, and swelling   If directed, put ice on  the injured area:  Put ice in a plastic bag.  Place a towel between your skin and the bag.  Leave the ice on for 20 minutes, 2-3 times a day.  Move your fingers often to avoid stiffness and to lessen swelling.  Raise (elevate) the injured area above the level of your heart while you are sitting or lying down.  If directed, apply heat to the affected area before you exercise. Use the heat source that your health care provider recommends, such as a moist heat pack or a heating pad.  Place a towel between your skin and the heat source.  Leave the heat on for 20-30 minutes.  Remove the heat if your skin turns bright red. This is especially important if you are unable to feel pain, heat, or cold. You may have a greater risk of getting burned. Activity   Return to your normal activities as told by your health care provider. Ask your health care provider what activities are safe for you.  Do not lift anything that is heavier than 10 lb (4.5 kg) until your health care provider tells you that it is safe.  Avoid activities that cause pain or make your condition worse.  Do exercises as told by your health care provider. General instructions   Take over-the-counter and prescription medicines only as told by your health care provider.  Do not drive or operate heavy machinery while taking prescription pain medicines.  Keep all follow-up visits as told by your health care provider. This is important. How is this prevented?  Warm up and stretch before being active.  Cool down and stretch after being active.  Give your body time to rest between periods of activity.  Make sure any equipment that you use is fitted to you.  Be safe and responsible while being active to avoid falls.  Do at least 150 minutes of moderate-intensity aerobic exercise each week, such as brisk walking or water aerobics.  Maintain physical fitness, including:  Strength.  Flexibility.  Cardiovascular  fitness.  Endurance. Contact a health care provider if:  You have symptoms that get worse or do not get better after 2 weeks of treatment.  You develop new symptoms. Get help right away if:  You develop severe pain. This information is not intended to replace advice given to you by your health care provider. Make sure you discuss any questions you have with your health care provider. Document Released: 08/30/2005 Document Revised: 05/06/2016 Document Reviewed: 08/08/2015 Elsevier Interactive Patient Education  2017 [employer].

## 2017-03-02 ENCOUNTER — Encounter (HOSPITAL_COMMUNITY): Payer: Self-pay | Admitting: Family Medicine

## 2017-03-02 ENCOUNTER — Emergency Department (HOSPITAL_COMMUNITY)
Admission: EM | Admit: 2017-03-02 | Discharge: 2017-03-03 | Disposition: A | Discharge status: Home / Self Care | Payer: 59 | Attending: Emergency Medicine | Admitting: Emergency Medicine

## 2017-03-02 DIAGNOSIS — M21612 Bunion of left foot: Secondary | ICD-10-CM | POA: Insufficient documentation

## 2017-03-02 DIAGNOSIS — J45909 Unspecified asthma, uncomplicated: Secondary | ICD-10-CM | POA: Insufficient documentation

## 2017-03-02 DIAGNOSIS — Z79899 Other long term (current) drug therapy: Secondary | ICD-10-CM | POA: Insufficient documentation

## 2017-03-02 DIAGNOSIS — M79672 Pain in left foot: Secondary | ICD-10-CM | POA: Diagnosis present

## 2017-03-02 NOTE — ED Triage Notes (Signed)
Patient is complaining of left foot pain that started today. Denies any recent injury. Pt reports a knot is at the medial side of his foot that he can move around internally.

## 2017-03-03 MED ORDER — NAPROXEN 250 MG PO TABS: 250.0000 mg | ORAL_TABLET | Freq: Two times a day (BID) | ORAL | 0 refills | Status: DC

## 2017-03-03 NOTE — ED Notes (Signed)
Pt ambulatory and independent at discharge.  Verbalized understanding of discharge instructions 

## 2017-03-03 NOTE — ED Provider Notes (Signed)
[facility] DEPT Provider Note   CSN: [id number] Arrival date & time: 03/02/17  2020     History   Chief Complaint Chief Complaint  Patient presents with  . Foot Pain    HPI Juan Rogers is a 41 y.o. male.  Juan Rogers is a 41 y.o. Male who presents to the emergency department complaining of left foot pain near his great toe ongoing for several months that has worsened over the past couple days. He has attempted no treatments for his symptoms today. He denies any injury or trauma to his foot. He denies fevers, numbness, tingling, weakness or rashes.   The history is provided by the patient and medical records. No language interpreter was used.  Foot Pain     Past Medical History:  Diagnosis Date  . Acne   . Asthma     Patient Active Problem List   Diagnosis Date Noted  . Biceps tendinopathy, right 01/19/2017  . ABNORMAL FINDINGS, ELEVATED BP W/O HTN 05/11/2007  . 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 FOOT SURGERY Right        Home Medications    Prior to Admission medications   Medication Sig Start Date End Date Taking? Authorizing Provider  naproxen (NAPROSYN) 250 MG tablet Take 1 tablet (250 mg total) by mouth 2 (two) times daily with a meal. 03/03/17   Waynetta Pean, PA-C  tetracycline (ACHROMYCIN,SUMYCIN) 250 MG capsule Take 1 capsule (250 mg total) by mouth 2 (two) times daily before a meal. 01/11/17   Rumley, Burna Cash, DO    Family History Family History  Problem Relation Age of Onset  . Asthma Mother     Social History Social History  Substance Use Topics  . Smoking status: Never Smoker  . Smokeless tobacco: Never Used  . Alcohol use No     Allergies   Patient has no known allergies.   Review of Systems Review of Systems  Constitutional: Negative for fever.  Musculoskeletal: Positive for arthralgias.  Skin: Negative for color change, rash and wound.  Neurological: Negative  for weakness and numbness.     Physical Exam Updated Vital Signs BP 137/83 (BP Location: Right Arm)   Pulse 80   Temp 97.7 F (36.5 C) (Oral)   Resp 18   Ht 6\' 1"  (1.854 m)   Wt 89.4 kg (197 lb)   SpO2 96%   BMI 25.99 kg/m   Physical Exam  Constitutional: He appears well-developed and well-nourished. No distress.  HENT:  Head: Normocephalic and atraumatic.  Eyes: Right eye exhibits no discharge. Left eye exhibits no discharge.  Cardiovascular: Normal rate, regular rhythm and intact distal pulses.   Bilateral dorsalis pedis and posterior tibialis pulses are intact.  Pulmonary/Chest: Effort normal. No respiratory distress.  Musculoskeletal: Normal range of motion. He exhibits tenderness. He exhibits no edema or deformity.  Tenderness and deformity noted to his left MTP joint. No erythema or edema or warmth. Patient with bunion.  Neurological: He is alert. No sensory deficit. Coordination normal.  Skin: Skin is warm and dry. Capillary refill takes less than 2 seconds. No rash noted. He is not diaphoretic. No erythema. No pallor.  Psychiatric: He has a normal mood and affect. His behavior is normal.  Nursing note and vitals reviewed.    ED Treatments / Results  Labs (all labs ordered are listed, but only abnormal results are displayed) Labs Reviewed - No data to display  EKG  EKG Interpretation None       Radiology No results found.  Procedures Procedures (including critical care time)  Medications Ordered in ED Medications - No data to display   Initial Impression / Assessment and Plan / ED Course  I have reviewed the triage vital signs and the nursing notes.  Pertinent labs & imaging results that were available during my care of the patient were reviewed by me and considered in my medical decision making (see chart for details).    This  is a 41 y.o. Male who presents to the emergency department complaining of left foot pain near his great toe ongoing for  several months that has worsened over the past couple days. He has attempted no treatments for his symptoms today. He denies any injury or trauma to his foot. On exam patient is afebrile and nontoxic appearing. He has a bunion noted to his left foot. No erythema, edema or warmth. No evidence of gout. I discussed orthotic treatments. Naproxen for pain control. Will have him follow-up with podiatry. I advised the patient to follow-up with their primary care provider this week. I advised the patient to return to the emergency department with new or worsening symptoms or new concerns. The patient verbalized understanding and agreement with plan.      Final Clinical Impressions(s) / ED Diagnoses   Final diagnoses:  Bunion of great toe of left foot    New Prescriptions New Prescriptions   NAPROXEN (NAPROSYN) 250 MG TABLET    Take 1 tablet (250 mg total) by mouth 2 (two) times daily with a meal.     Waynetta Pean, PA-C 16/10/96 0454    Delora Fuel, MD 09/81/19 [phone]

## 2017-03-30 ENCOUNTER — Other Ambulatory Visit: Payer: Self-pay | Admitting: *Deleted

## 2017-03-30 DIAGNOSIS — L700 Acne vulgaris: Secondary | ICD-10-CM

## 2017-03-30 MED ORDER — TETRACYCLINE HCL 250 MG PO CAPS: 250.0000 mg | ORAL_CAPSULE | Freq: Two times a day (BID) | ORAL | 1 refills | Status: DC

## 2017-06-13 ENCOUNTER — Other Ambulatory Visit: Payer: Self-pay | Admitting: *Deleted

## 2017-06-13 DIAGNOSIS — L700 Acne vulgaris: Secondary | ICD-10-CM

## 2017-07-05 ENCOUNTER — Other Ambulatory Visit: Payer: Self-pay | Admitting: Student in an Organized Health Care Education/Training Program

## 2017-07-05 DIAGNOSIS — L700 Acne vulgaris: Secondary | ICD-10-CM

## 2017-07-08 ENCOUNTER — Ambulatory Visit: Payer: 59 | Admitting: Student in an Organized Health Care Education/Training Program

## 2017-07-18 ENCOUNTER — Encounter: Payer: Self-pay | Admitting: Student in an Organized Health Care Education/Training Program

## 2017-07-18 ENCOUNTER — Ambulatory Visit (INDEPENDENT_AMBULATORY_CARE_PROVIDER_SITE_OTHER): Payer: 59 | Admitting: Student in an Organized Health Care Education/Training Program

## 2017-07-18 VITALS — BP 126/74 | HR 82 | Temp 98.1°F | Ht 73.0 in | Wt 199.6 lb

## 2017-07-18 DIAGNOSIS — L700 Acne vulgaris: Secondary | ICD-10-CM | POA: Diagnosis not present

## 2017-07-18 DIAGNOSIS — D229 Melanocytic nevi, unspecified: Secondary | ICD-10-CM | POA: Diagnosis not present

## 2017-07-18 MED ORDER — TETRACYCLINE HCL 250 MG PO CAPS: 250.0000 mg | ORAL_CAPSULE | Freq: Two times a day (BID) | ORAL | 1 refills | Status: DC

## 2017-07-18 NOTE — Patient Instructions (Signed)
It was a pleasure seeing you today in our clinic. Today we discussed your acne and your moles. Here is the treatment plan we have discussed and agreed upon together:  Please schedule follow up in [place] as you leave today so that the moles may be removed.  We collected blood work for long term tetracycline use.  I will call or send you a letter with these results. If you do not hear from me within the next week, please give our office a call.  Please schedule a visit to be seen in 6 months for repeat blood work or sooner if you need [place].  Our clinic's number is [phone]. Please call with questions or concerns about what we discussed today.  Be well, Dr. Burr Medico

## 2017-07-18 NOTE — Progress Notes (Signed)
   CC: acne follow up and moles  HPI: Juan Rogers is a 41 y.o. male with PMH significant for acne.  Moles Patient has two moles on his stomach and one on his chest that have recently started itching and burning. He feels they did not previously look dark in color but now they do. He would like these to be removed.  Acne Patient has been managed on tetracycline which he reports is the only medication that helps control his acne. He has tried topicals in the past, however he has an intolerance to benzoyl peroxide which causes skin irritation.   Review of Symptoms:  See HPI for ROS.   CC, SH/smoking status, and VS noted.  Objective: BP 126/74   Pulse 82   Temp 98.1 F (36.7 C) (Oral)   Ht 6\' 1"  (1.854 m)   Wt 199 lb 9.6 oz (90.5 kg)   SpO2 98%   BMI 26.33 kg/m  GEN: NAD, alert, cooperative, and pleasant. Skin: +3 nevi noted over anterior chest wall and stomach, ~1cm in length and raised without surrounding skin irritation or erythema. +Scarring noted over face from acne with some comedones and pustules.  Assessment and plan:  Inflammatory acne Patient reports he has been managed on tetracycline. He has tried topicals in the past which have not been effective against his acne. He reports that he has a reaction to medications containing benzoyl peroxide. Discussed risks of continuing tetracycline long-term. Patient elects to continue with the tetracycline as this is the only medication is working for him. Burnis Medin check CMP - If LFTs are normal, plan to recheck in 6 months and then can space out to 1 year - Refill tetracycline  Nevus Patient like 3 nevi removed. Advised to make an appointment and procedures clinic as he leaves the office today.   Orders Placed This Encounter  Procedures  . Comprehensive metabolic panel    Meds ordered this encounter  Medications  . tetracycline (ACHROMYCIN,SUMYCIN) 250 MG capsule    Sig: Take 1 capsule (250 mg total) 2 (two) times daily  before a meal by mouth.    Dispense:  60 capsule    Refill:  1     Everrett Coombe, MD,MS,  PGY2 07/19/2017 8:47 AM

## 2017-07-19 ENCOUNTER — Encounter: Payer: Self-pay | Admitting: Student in an Organized Health Care Education/Training Program

## 2017-07-19 DIAGNOSIS — L700 Acne vulgaris: Secondary | ICD-10-CM | POA: Insufficient documentation

## 2017-07-19 LAB — COMPREHENSIVE METABOLIC PANEL
A/G RATIO: 2 (ref 1.2–2.2)
ALBUMIN: 4.5 g/dL (ref 3.5–5.5)
ALT: 17 IU/L (ref 0–44)
AST: 20 IU/L (ref 0–40)
Alkaline Phosphatase: 59 IU/L (ref 39–117)
BILIRUBIN TOTAL: 0.2 mg/dL (ref 0.0–1.2)
BUN / CREAT RATIO: 8 — AB (ref 9–20)
BUN: 11 mg/dL (ref 6–20)
CHLORIDE: 102 mmol/L (ref 96–106)
CO2: 26 mmol/L (ref 20–29)
Calcium: 9.6 mg/dL (ref 8.7–10.2)
Creatinine, Ser: 1.31 mg/dL — ABNORMAL HIGH (ref 0.76–1.27)
GFR calc non Af Amer: 68 mL/min/{1.73_m2} (ref >59)
GFR, EST AFRICAN AMERICAN: 79 mL/min/{1.73_m2} (ref >59)
GLOBULIN, TOTAL: 2.2 g/dL (ref 1.5–4.5)
Glucose: 134 mg/dL — ABNORMAL HIGH (ref 65–99)
POTASSIUM: 4.7 mmol/L (ref 3.5–5.2)
SODIUM: 142 mmol/L (ref 134–144)
TOTAL PROTEIN: 6.7 g/dL (ref 6.0–8.5)

## 2017-07-19 NOTE — Assessment & Plan Note (Signed)
Patient like 3 nevi removed. Advised to make an appointment and procedures clinic as he leaves the office today.

## 2017-07-19 NOTE — Assessment & Plan Note (Signed)
Patient reports he has been managed on tetracycline. He has tried topicals in the past which have not been effective against his acne. He reports that he has a reaction to medications containing benzoyl peroxide. Discussed risks of continuing tetracycline long-term. Patient elects to continue with the tetracycline as this is the only medication is working for him. Burnis Medin check CMP - If LFTs are normal, plan to recheck in 6 months and then can space out to 1 year - Refill tetracycline

## 2017-08-11 ENCOUNTER — Other Ambulatory Visit: Payer: Self-pay

## 2017-08-11 ENCOUNTER — Ambulatory Visit (INDEPENDENT_AMBULATORY_CARE_PROVIDER_SITE_OTHER): Payer: 59 | Admitting: Family Medicine

## 2017-08-11 VITALS — BP 118/70 | HR 74 | Temp 98.1°F | Wt 198.0 lb

## 2017-08-11 DIAGNOSIS — D485 Neoplasm of uncertain behavior of skin: Secondary | ICD-10-CM | POA: Diagnosis not present

## 2017-08-11 DIAGNOSIS — D229 Melanocytic nevi, unspecified: Secondary | ICD-10-CM

## 2017-08-11 NOTE — Assessment & Plan Note (Signed)
3 nevi, likely benign, removed today. See procedure notes below. Specimens sent for path review. Patient tolerated well and discussed aftercare and return precautions.

## 2017-08-11 NOTE — Progress Notes (Signed)
    Subjective:  Juan Rogers is a 41 y.o. male who presents to the [facility] today for mole removal  HPI:  Has 2 moles on abdomen  That has been present for years but over the last 2 months has both darkened and grown taller. Has an additional spot on upper chest that is light in color which is how the moles on his abdomen initially started. No personal history of skin cancer. Only previous skin procedure was for skin tag removal in 2014 No bleeding or drainage.   ROS: Per HPI  Objective:  Physical Exam: BP 118/70   Pulse 74   Temp 98.1 F (36.7 C) (Oral)   Wt 198 lb (89.8 kg)   SpO2 98%   BMI 26.12 kg/m   Skin: 2 small ~29mm dark slightly raised papules on L anterior abdomen. 1 small ~74mm flesh colored papule on anterior chest.     Assessment/Plan:  Nevus 3 nevi, likely benign, removed today. See procedure notes below. Specimens sent for path review. Patient tolerated well and discussed aftercare and return precautions.  PROCEDURE NOTE: Skin biopsy Patient given informed consent, signed copy in the chart. Appropriate time out taken. Area prepped and cleaned usual fashion. A 3 cc of 1% lidocaine with epinephrine was used for local anesthesia. Once anesthesia was obtained,  A 49mm keyes punch was used to obtain a sample of the first and third lesions. The superficial layers of the second lesion were removed by shave biopsy. These were sent for pathology. Pressure and monsels used for hemostasis. Amount of bleeding was extremely minimal, less than 2 cc. There were no complications. Patient was given post procedure instructions including signs to watch for such as erythema, pain, plus, unusual swelling. Pathology is pending and the patient will be contacted regarding results.  Bufford Lope, DO PGY-2, [facility] 08/11/2017 8:41 AM

## 2017-08-11 NOTE — Patient Instructions (Signed)
Please keep area clean and covered with over the counter antibiotic ointment and bandaid until heals over.  Please let us know if gets infected, worsening redness, drainage or fever.  We will let you know if the results require attention.  Feel free to call with any questions or concerns at any time, at [phone].   Take care,  Dr. Bufford Lope, [place]

## 2017-08-12 NOTE — Progress Notes (Signed)
   FMTS Attending  Note: Dorcas Mcmurray MD I have discussed this patient with the resident and reviewed the assessment and plan as documented above. I was present and is patent the procedure . I agree with the resident's findings and plan.

## 2017-08-26 ENCOUNTER — Telehealth: Payer: Self-pay | Admitting: Student in an Organized Health Care Education/Training Program

## 2017-08-26 NOTE — Telephone Encounter (Signed)
Routing to PCP as well as doctors that saw pt for this.Juan Rogers, [name], [place]

## 2017-08-26 NOTE — Telephone Encounter (Signed)
Would like results from moles being removed about 2 weeks ago

## 2017-08-26 NOTE — Telephone Encounter (Signed)
[phone] (M) Left VM mssg---all results (biopsies) normal. I will send him a letter in mail The results did not show up in [employer] but Herbie Baltimore was able to get a copy which I will scan to chart Juan Rogers

## 2017-08-26 NOTE — Telephone Encounter (Signed)
It does not look as though these have resulted yet

## 2017-09-15 ENCOUNTER — Other Ambulatory Visit: Payer: Self-pay | Admitting: Student in an Organized Health Care Education/Training Program

## 2017-09-15 DIAGNOSIS — L700 Acne vulgaris: Secondary | ICD-10-CM

## 2017-12-12 ENCOUNTER — Encounter: Payer: Self-pay | Admitting: Internal Medicine

## 2017-12-12 ENCOUNTER — Ambulatory Visit (INDEPENDENT_AMBULATORY_CARE_PROVIDER_SITE_OTHER): Payer: 59 | Admitting: Internal Medicine

## 2017-12-12 ENCOUNTER — Other Ambulatory Visit: Payer: Self-pay

## 2017-12-12 VITALS — BP 138/76 | HR 80 | Temp 98.0°F | Wt 199.0 lb

## 2017-12-12 DIAGNOSIS — M545 Low back pain, unspecified: Secondary | ICD-10-CM | POA: Insufficient documentation

## 2017-12-12 MED ORDER — CYCLOBENZAPRINE HCL 5 MG PO TABS: 5.0000 mg | ORAL_TABLET | Freq: Three times a day (TID) | ORAL | 0 refills | Status: DC | PRN

## 2017-12-12 MED ORDER — METHYLPREDNISOLONE ACETATE 80 MG/ML IJ SUSP
80.0000 mg | Freq: Once | INTRAMUSCULAR | Status: AC
  Administered 2017-12-12: 80 mg via INTRAMUSCULAR

## 2017-12-12 MED ORDER — GABAPENTIN 300 MG PO CAPS: 300.0000 mg | ORAL_CAPSULE | Freq: Three times a day (TID) | ORAL | 0 refills | Status: DC | PRN

## 2017-12-12 MED ORDER — KETOROLAC TROMETHAMINE 60 MG/2ML IM SOLN
60.0000 mg | Freq: Once | INTRAMUSCULAR | Status: AC
  Administered 2017-12-12: 60 mg via INTRAMUSCULAR

## 2017-12-12 NOTE — Patient Instructions (Addendum)
It was so nice to meet you! I'm so sorry you hurt your back. I think you may have a herniated disc.  We have given you two anti-inflammatory shots in clinic today.  I have prescribed a muscle relaxer called Flexeril. You can take 1 tablet three times a day as needed. I have also prescribed a nerve pain medication called Gabapentin. You can take this three times a day as needed. Both of these medications may make you sleepy, so I would start by taking them at bedtime.  You should also take Ibuprofen 200mg  and Tylenol 325mg  together every 6 hours scheduled.  Your back pain may take 6-12 weeks to completely get better.  If you feel like you are not getting any better after 6 weeks, please come back to see [place]. We may need to get imaging at that time.  -Dr. Brett Albino

## 2017-12-12 NOTE — Assessment & Plan Note (Signed)
Acute pain after lifting door. Patient does have pain that radiates down the back of his left leg and a positive straight leg raise on the left, so think herniated disc is likely. Another possibility is acute lumbosacral strain, although this would not account for this radicular symptoms. No back pain red flags or focal neurological deficits. - Patient given Toradol 60mg  IM and Depomedrol 80mg  IM in clinic today - Also prescribed Flexeril 5mg  tid prn for muscle spasm and Gabapentin 300mg  tid prn  - Advised that patient take Tylenol 325mg  and Ibuprofen 200mg  together every 6 hours scheduled - Discussed that patient should continue to stay active and avoid complete bed rest - Also discussed that this may take 6-12 weeks to completely resolve - Return precautions discussed - Follow-up in 6 weeks if no improvement or earlier if needed

## 2017-12-12 NOTE — Progress Notes (Signed)
   [facility] Phone: [phone]  Subjective:  Juan Rogers is a 42 year old male presenting to clinic with low back pain for the last 3 days. He took the front door off his house and was trying to turn it to the side when he twisted his back and felt immediate onset of sharp pain. The pain is located in the center and left side of his back. The pain radiates to the front of his pelvis and down the back of his left leg to his knee. The pain is worse with trying to stand straight up. The pain is better with leaning over. He has been taking Aleve 200mg  every 4-8 hours, which hasn't been helping. He is having a difficult time performing his job duties. 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 No saddle anesthesia, no bowel/bladder incontinence, no pain that wakes him up at night.  ROS: See HPI for pertinent positives and negatives  Past Medical History- none  Family history reviewed for today's visit. No changes.  Social history- patient is a never smoker  Objective: BP 138/76   Pulse 80   Temp 98 F (36.7 C) (Oral)   Wt 199 lb (90.3 kg)   SpO2 97%   BMI 26.25 kg/m  Gen: NAD, alert, appears to be in moderate pain, frequently moving to try to get comfortable. Back: sits and stands in a flexed position, decreased extension due to pain, +tenderness to palpation of the paraspinal muscles of the lumbar spine, muscle spasm present, no midline tenderness to palpation. Neuro: Awake, alert; straight leg raise positive on the left and negative on the right. Sensation intact to light touch throughout the lower extremities, 5/5 muscle strength in the lower extremities, reflexes symmetric.  Assessment/Plan: Acute Low Back Pain with Left Sided Radiculopathy Acute pain after lifting door. Patient does have pain that radiates down the back of his left leg and a positive straight leg raise on the left, so think herniated disc is likely. Another  possibility is acute lumbosacral strain, although this would not account for this radicular symptoms. No back pain red flags or focal neurological deficits. - Patient given Toradol 60mg  IM and Depomedrol 80mg  IM in clinic today - Also prescribed Flexeril 5mg  tid prn for muscle spasm and Gabapentin 300mg  tid prn  - Advised that patient take Tylenol 325mg  and Ibuprofen 200mg  together every 6 hours scheduled - Discussed that patient should continue to stay active and avoid complete bed rest - Also discussed that this may take 6-12 weeks to completely resolve - Return precautions discussed - Follow-up in 6 weeks if no improvement or earlier if needed  Hyman Bible, MD PGY-3

## 2017-12-19 ENCOUNTER — Other Ambulatory Visit: Payer: Self-pay | Admitting: Student in an Organized Health Care Education/Training Program

## 2017-12-19 DIAGNOSIS — L700 Acne vulgaris: Secondary | ICD-10-CM

## 2018-03-27 ENCOUNTER — Ambulatory Visit (INDEPENDENT_AMBULATORY_CARE_PROVIDER_SITE_OTHER): Payer: 59 | Admitting: Family Medicine

## 2018-03-27 ENCOUNTER — Encounter: Payer: Self-pay | Admitting: Family Medicine

## 2018-03-27 DIAGNOSIS — A084 Viral intestinal infection, unspecified: Secondary | ICD-10-CM | POA: Diagnosis not present

## 2018-03-27 MED ORDER — ONDANSETRON 4 MG PO TBDP: 4.0000 mg | ORAL_TABLET | Freq: Three times a day (TID) | ORAL | 0 refills | Status: DC | PRN

## 2018-03-27 NOTE — [place] [facility] (Signed)
   Subjective:   Patient ID: Juan Rogers    DOB: 1975/12/17, 42 y.o. male   MRN: [id number]  CC: nausea, diarrhea   HPI: Juan Rogers is a 42 y.o. male who presents to clinic today for the following issue.  Vomiting, diarrhea Patient states on Saturday night he had [employer] stuffed crust pizza with some friends.  Two hours later, he developed some stomach cramping and had to use the bathroom several times overnight.  No blood noted in stool. At about 2AM he had some nausea and vomiting, 4-5 episodes total. Last emesis at 730 this morning. Has been able to tolerate fluids in between but has not been eating much due to nausea. Stomach feels sore from throwing up but no specific abdominal pain.   Friends also having loose stools but no nausea and vomiting.  No fever, chills.  Has not taken any medicine so far for this.   ROS: See HPI for pertinent ROS.  Social: pt is a never smoker  Medications reviewed. Objective:   BP 120/70 (BP Location: Left Arm, Patient Position: Sitting, Cuff Size: Normal)   Pulse 70   Temp 98.2 F (36.8 C) (Oral)   Wt 199 lb 9.6 oz (90.5 kg)   SpO2 99%   BMI 26.33 kg/m  Vitals and nursing note reviewed.  General: well appearing 42 yo male, NAD  HEENT: normocephalic, atraumatic, moist mucous membranes Neck: supple, non-tender without lymphadenopathy CV: regular rate and rhythm without murmurs rubs or gallops Lungs: clear to auscultation bilaterally with normal work of breathing Abdomen: soft, non-tender, no masses or organomegaly palpable, normoactive bowel sounds Skin: warm, dry, no rashes or lesions, cap refill < 2 seconds Extremities: warm and well perfused, normal tone  Assessment & Plan:   Viral gastroenteritis Likely 2/2 food poisoning as friends who ate same food with similar symptoms.  Discussed this can take several days to take its course, however anticipate improvement within the next day or so.  Advised drinking plenty of fluids to  replete hydration. -Rx: Zofran  -Recommend imodium, pt states he has this at home -f/u if symptoms worsen or do not improve  Meds ordered this encounter  Medications  . ondansetron (ZOFRAN-ODT) 4 MG disintegrating tablet    Sig: Take 1 tablet (4 mg total) by mouth every 8 (eight) hours as needed for nausea or vomiting.    Dispense:  20 tablet    Refill:  0    Lovenia Kim, MD [place], PGY-3

## 2018-03-27 NOTE — Patient Instructions (Signed)
It was nice meeting you today.  You were seen in clinic for nausea, vomiting and diarrhea which is most likely due to a viral gastroenteritis.  As we discussed, this can take several days to resolve and hydration is most important. I am including some information below that you may find helpful.  It is also important that you wash your hands frequently to prevent spread.  I am sending in a prescription for Zofran which will help with your nausea.  Additionally, you can use the Imodium that you have at home for diarrhea.  If you have any new or worsening symptoms, please make an appointment to be seen by a provider.  Be well, Lovenia Kim MD    Viral Gastroenteritis, Adult Viral gastroenteritis is also known as the stomach flu. This condition is caused by certain germs (viruses). These germs can be passed from person to person very easily (are very contagious). This condition can cause sudden watery poop (diarrhea), fever, and throwing up (vomiting). Having watery poop and throwing up can make you feel weak and cause you to get dehydrated. Dehydration can make you tired and thirsty, make you have a dry mouth, and make it so you pee (urinate) less often. Older adults and people with other diseases or a weak defense system (immune system) are at higher risk for dehydration. It is important to replace the fluids that you lose from having watery poop and throwing up. Follow these instructions at home: Follow instructions from your doctor about how to care for yourself at home. Eating and drinking  Follow these instructions as told by your doctor:  Take an oral rehydration solution (ORS). This is a drink that is sold at pharmacies and stores.  Drink clear fluids in small amounts as you are able, such as: ? Water. ? Ice chips. ? Diluted fruit juice. ? Low-calorie sports drinks.  Eat bland, easy-to-digest foods in small amounts as you are able, such as: ? Bananas. ? Applesauce. ? Rice. ? Low-fat  (lean) meats. ? Toast. ? Crackers.  Avoid fluids that have a lot of sugar or caffeine in them.  Avoid alcohol.  Avoid spicy or fatty foods.  General instructions  Drink enough fluid to keep your pee (urine) clear or pale yellow.  Wash your hands often. If you cannot use soap and water, use hand sanitizer.  Make sure that all people in your home wash their hands well and often.  Rest at home while you get better.  Take over-the-counter and prescription medicines only as told by your doctor.  Watch your condition for any changes.  Take a warm bath to help with any burning or pain from having watery poop.  Keep all follow-up visits as told by your doctor. This is important. Contact a doctor if:  You cannot keep fluids down.  Your symptoms get worse.  You have new symptoms.  You feel light-headed or dizzy.  You have muscle cramps. Get help right away if:  You have chest pain.  You feel very weak or you pass out (faint).  You see blood in your throw-up.  Your throw-up looks like coffee grounds.  You have bloody or black poop (stools) or poop that look like tar.  You have a very bad headache, a stiff neck, or both.  You have a rash.  You have very bad pain, cramping, or bloating in your belly (abdomen).  You have trouble breathing.  You are breathing very quickly.  Your heart is beating very  quickly.  Your skin feels cold and clammy.  You feel confused.  You have pain when you pee.  You have signs of dehydration, such as: ? Dark pee, hardly any pee, or no pee. ? Cracked lips. ? Dry mouth. ? Sunken eyes. ? Sleepiness. ? Weakness. This information is not intended to replace advice given to you by your health care provider. Make sure you discuss any questions you have with your health care provider. Document Released: 02/16/2008 Document Revised: 03/19/2016 Document Reviewed: 05/06/2015 Elsevier Interactive Patient Education  2017 [employer].

## 2018-04-02 ENCOUNTER — Emergency Department (HOSPITAL_COMMUNITY)
Admission: EM | Admit: 2018-04-02 | Discharge: 2018-04-02 | Disposition: A | Discharge status: Home / Self Care | Payer: 59 | Attending: Emergency Medicine | Admitting: Emergency Medicine

## 2018-04-02 ENCOUNTER — Encounter (HOSPITAL_COMMUNITY): Payer: Self-pay

## 2018-04-02 DIAGNOSIS — Z79899 Other long term (current) drug therapy: Secondary | ICD-10-CM | POA: Diagnosis not present

## 2018-04-02 DIAGNOSIS — B356 Tinea cruris: Secondary | ICD-10-CM | POA: Diagnosis not present

## 2018-04-02 DIAGNOSIS — L299 Pruritus, unspecified: Secondary | ICD-10-CM | POA: Diagnosis present

## 2018-04-02 DIAGNOSIS — J45909 Unspecified asthma, uncomplicated: Secondary | ICD-10-CM | POA: Insufficient documentation

## 2018-04-02 MED ORDER — CLOTRIMAZOLE 1 % EX CREA: TOPICAL_CREAM | CUTANEOUS | 1 refills | Status: DC

## 2018-04-02 NOTE — Discharge Instructions (Signed)
Apply the antifungal cream twice daily for 4 weeks, or for 1 week after lesions have healed.  Follow-up with a primary care provider on this matter.

## 2018-04-02 NOTE — ED Provider Notes (Addendum)
[facility] EMERGENCY DEPARTMENT Provider Note   CSN: [id number] Arrival date & time: 04/02/18  3716     History   Chief Complaint Chief Complaint  Patient presents with  . Abscess    HPI Juan Rogers is a 42 y.o. male.  HPI   Juan Rogers is a 42 y.o. male, with a history of asthma and acne, presenting to the ED with perineal irritation and itching for the last 2 weeks.  Has been putting A&E ointment without improvement.  Denies fever, drainage, testicular pain or swelling, pain with bowel movements, dysuria, penile discharge, or any other complaints.   Past Medical History:  Diagnosis Date  . Acne   . Asthma     Patient Active Problem List   Diagnosis Date Noted  . Acute bilateral low back pain 12/12/2017  . Acne vulgaris 07/19/2017  . Biceps tendinopathy, right 01/19/2017  . Nevus 10/19/2012  . 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 FOOT SURGERY Right         Home Medications    Prior to Admission medications   Medication Sig Start Date End Date Taking? Authorizing Provider  clotrimazole (LOTRIMIN) 1 % cream Apply to affected area 2 times daily for 4 weeks, or for 1 week after lesions have healed. 04/02/18   Joy, Shawn C, PA-C  cyclobenzaprine (FLEXERIL) 5 MG tablet Take 1 tablet (5 mg total) by mouth 3 (three) times daily as needed for muscle spasms. 12/12/17   Mayo, Pete Pelt, MD  gabapentin (NEURONTIN) 300 MG capsule Take 1 capsule (300 mg total) by mouth 3 (three) times daily as needed. 12/12/17   Mayo, Pete Pelt, MD  naproxen (NAPROSYN) 250 MG tablet Take 1 tablet (250 mg total) by mouth 2 (two) times daily with a meal. 03/03/17   Waynetta Pean, PA-C  ondansetron (ZOFRAN-ODT) 4 MG disintegrating tablet Take 1 tablet (4 mg total) by mouth every 8 (eight) hours as needed for nausea or vomiting. 03/27/18   Lovenia Kim, MD  tetracycline (ACHROMYCIN,SUMYCIN) 250 MG capsule TAKE 1 CAPSULE(250  MG) BY MOUTH TWICE DAILY BEFORE A MEAL 12/21/17   Everrett Coombe, MD    Family History Family History  Problem Relation Age of Onset  . Asthma Mother     Social History Social History   Tobacco Use  . Smoking status: Never Smoker  . Smokeless tobacco: Never Used  Substance Use Topics  . Alcohol use: No  . Drug use: No    Comment: Denies recreational drug use.     Allergies   Patient has no known allergies.   Review of Systems Review of Systems  Constitutional: Negative for fever.  Genitourinary: Negative for dysuria, frequency, hematuria, scrotal swelling and testicular pain.  Skin: Positive for rash.     Physical Exam Updated Vital Signs BP (!) 141/95 (BP Location: Right Arm)   Pulse 73   Temp (!) 97.5 F (36.4 C) (Oral)   Resp 20   SpO2 100%   Physical Exam  Constitutional: He appears well-developed and well-nourished. No distress.  HENT:  Head: Normocephalic and atraumatic.  Eyes: Conjunctivae are normal.  Neck: Neck supple.  Cardiovascular: Normal rate and regular rhythm.  Pulmonary/Chest: Effort normal.  Abdominal: Soft. There is no tenderness. There is no guarding.  Genitourinary:     Genitourinary Comments: RN, Marya Amsler, served as Producer, television/film/video.  Neurological: He is alert.  Skin: Skin is warm and dry. He is not diaphoretic.  No pallor.  Psychiatric: He has a normal mood and affect. His behavior is normal.  Nursing note and vitals reviewed.    ED Treatments / Results  Labs (all labs ordered are listed, but only abnormal results are displayed) Labs Reviewed - No data to display  EKG None  Radiology No results found.  Procedures Procedures (including critical care time)  Medications Ordered in ED Medications - No data to display   Initial Impression / Assessment and Plan / ED Course  I have reviewed the triage vital signs and the nursing notes.  Pertinent labs & imaging results that were available during my care of the patient were reviewed  by me and considered in my medical decision making (see chart for details).  Clinical Course as of Apr 02 1150  Sun Apr 02, 2018  1114 Patient requested male provider. I requested assistance of Shawn Joy, [place], who will see pt. No evaluation performed by me. Alyssa B. Cathi Roan   [AM]    Clinical Course User Index [AM] Albesa Seen, PA-C   Patient presents with perineal irritation.  Presentation is suggestive of tinea cruris.  We will try topical antifungal.  PCP follow-up.  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  The patient was given instructions for home care as well as return precautions. Patient voices understanding of these instructions, accepts the plan, and is comfortable with discharge.  Final Clinical Impressions(s) / ED Diagnoses   Final diagnoses:  Tinea cruris    ED Discharge Orders        Ordered    clotrimazole (LOTRIMIN) 1 % cream     04/02/18 1146       Lorayne Bender, PA-C 04/02/18 1153    Lorayne Bender, PA-C 04/02/18 1157    Sherwood Gambler, MD 04/04/18 1541

## 2018-04-02 NOTE — ED Triage Notes (Signed)
Pt presents for evaluation of boil or abscess to groin area. Pt reports its painful with rubbing of pants.

## 2018-04-02 NOTE — ED Notes (Signed)
Pt verbalized understanding discharge instructions and denies any further needs or questions at this time. VS stable, ambulatory and steady gait.   

## 2018-04-03 DIAGNOSIS — A084 Viral intestinal infection, unspecified: Secondary | ICD-10-CM | POA: Insufficient documentation

## 2018-04-03 NOTE — Assessment & Plan Note (Addendum)
Likely 2/2 food poisoning as friends who ate same food with similar symptoms.  Discussed this can take several days to take its course, however anticipate improvement within the next day or so.  Advised drinking plenty of fluids to replete hydration. -Rx: Zofran  -Recommend imodium, pt states he has this at home -f/u if symptoms worsen or do not improve

## 2018-04-17 ENCOUNTER — Other Ambulatory Visit: Payer: Self-pay | Admitting: Student in an Organized Health Care Education/Training Program

## 2018-04-17 DIAGNOSIS — L700 Acne vulgaris: Secondary | ICD-10-CM

## 2018-04-30 ENCOUNTER — Other Ambulatory Visit: Payer: Self-pay | Admitting: Student in an Organized Health Care Education/Training Program

## 2018-04-30 DIAGNOSIS — L700 Acne vulgaris: Secondary | ICD-10-CM

## 2018-08-17 ENCOUNTER — Emergency Department (HOSPITAL_COMMUNITY)
Admission: EM | Admit: 2018-08-17 | Discharge: 2018-08-17 | Disposition: A | Discharge status: Home / Self Care | Payer: 59 | Attending: Emergency Medicine | Admitting: Emergency Medicine

## 2018-08-17 ENCOUNTER — Encounter (HOSPITAL_COMMUNITY): Payer: Self-pay

## 2018-08-17 ENCOUNTER — Emergency Department (HOSPITAL_COMMUNITY): Payer: 59

## 2018-08-17 ENCOUNTER — Other Ambulatory Visit: Payer: Self-pay

## 2018-08-17 DIAGNOSIS — J45909 Unspecified asthma, uncomplicated: Secondary | ICD-10-CM | POA: Insufficient documentation

## 2018-08-17 DIAGNOSIS — R000 Tachycardia, unspecified: Secondary | ICD-10-CM | POA: Diagnosis not present

## 2018-08-17 DIAGNOSIS — R0602 Shortness of breath: Secondary | ICD-10-CM | POA: Diagnosis not present

## 2018-08-17 DIAGNOSIS — R0789 Other chest pain: Secondary | ICD-10-CM | POA: Diagnosis not present

## 2018-08-17 DIAGNOSIS — Z79899 Other long term (current) drug therapy: Secondary | ICD-10-CM | POA: Diagnosis not present

## 2018-08-17 DIAGNOSIS — R42 Dizziness and giddiness: Secondary | ICD-10-CM | POA: Diagnosis not present

## 2018-08-17 DIAGNOSIS — R079 Chest pain, unspecified: Secondary | ICD-10-CM | POA: Diagnosis not present

## 2018-08-17 LAB — CBC
HCT: 49.9 % (ref 39.0–52.0)
HEMOGLOBIN: 16.6 g/dL (ref 13.0–17.0)
MCH: 30.5 pg (ref 26.0–34.0)
MCHC: 33.3 g/dL (ref 30.0–36.0)
MCV: 91.7 fL (ref 80.0–100.0)
Platelets: 395 10*3/uL (ref 150–400)
RBC: 5.44 MIL/uL (ref 4.22–5.81)
RDW: 12.4 % (ref 11.5–15.5)
WBC: 6.3 10*3/uL (ref 4.0–10.5)
nRBC: 0 % (ref 0.0–0.2)

## 2018-08-17 LAB — BASIC METABOLIC PANEL
Anion gap: 10 (ref 5–15)
BUN: 14 mg/dL (ref 6–20)
CHLORIDE: 103 mmol/L (ref 98–111)
CO2: 27 mmol/L (ref 22–32)
Calcium: 9.8 mg/dL (ref 8.9–10.3)
Creatinine, Ser: 1.26 mg/dL — ABNORMAL HIGH (ref 0.61–1.24)
GFR calc Af Amer: >60 mL/min (ref >60)
GFR calc non Af Amer: >60 mL/min (ref >60)
Glucose, Bld: 114 mg/dL — ABNORMAL HIGH (ref 70–99)
Potassium: 4.2 mmol/L (ref 3.5–5.1)
Sodium: 140 mmol/L (ref 135–145)

## 2018-08-17 LAB — I-STAT TROPONIN, ED
Troponin i, poc: 0 ng/mL (ref 0.00–0.08)
Troponin i, poc: 0.01 ng/mL (ref 0.00–0.08)

## 2018-08-17 NOTE — Discharge Instructions (Addendum)
You were seen in the emergency department for chest pain.  You had blood work chest x-ray and EKG that did not show an obvious cause of your symptoms.  It will be important for you to follow-up with cardiology.  Please return if any concerns.

## 2018-08-17 NOTE — ED Triage Notes (Signed)
Pt states that he has been having left sided chest pain, shortness of breath with exertion, and dizziness on an off for a while. Pt states that it got bad at work today, so he got someone to drive him . Pt states that in the past, he has "passed out at work and had to get my heart shocked back into rhythm.

## 2018-08-17 NOTE — ED Provider Notes (Signed)
[facility] DEPT Provider Note   CSN: [id number] Arrival date & time: 08/17/18  4098     History   Chief Complaint Chief Complaint  Patient presents with  . Chest Pain  . Shortness of Breath  . Dizziness    HPI Juan Rogers is a 42 y.o. male.  He is presenting with complaint of 4 out of 10 chest pain.  He says he gets this pain daily and will last for a few minutes.  It does not seem to be associated with activity or anything in particular.  When he gets the pain he feels short of breath and sometimes will radiate into his left arm.  He also felt lightheaded with this.  It happened today while at work and so he had somebody bring him here.  He says over a year ago he was evaluated at [facility] for rapid heart rate and got shocked and was put on Xarelto for period of time.  He followed up with cardiology.  The history is provided by the patient.  Chest Pain   This is a recurrent problem. The current episode started less than 1 hour ago. The problem has not changed since onset.The pain is associated with walking. The pain is present in the substernal region. The pain is at a severity of 4/10. The quality of the pain is described as pressure-like. The pain does not radiate. Associated symptoms include dizziness and shortness of breath. Pertinent negatives include no fever, no leg pain, no lower extremity edema, no malaise/fatigue, no nausea, no near-syncope and no syncope. He has tried nothing for the symptoms. The treatment provided no relief.  His past medical history is significant for arrhythmia.  Procedure history is positive for echocardiogram and persantine thallium.  Shortness of Breath  Associated symptoms include chest pain. Pertinent negatives include no fever, no sore throat, no neck pain, no syncope, no rash and no leg pain.  Dizziness  Associated symptoms: chest pain and shortness of breath   Associated symptoms: no nausea and no syncope       Past Medical History:  Diagnosis Date  . Acne   . Asthma     Patient Active Problem List   Diagnosis Date Noted  . Viral gastroenteritis 04/03/2018  . Acute bilateral low back pain 12/12/2017  . Acne vulgaris 07/19/2017  . Biceps tendinopathy, right 01/19/2017  . Nevus 10/19/2012  . 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 FOOT SURGERY Right         Home Medications    Prior to Admission medications   Medication Sig Start Date End Date Taking? Authorizing Provider  clotrimazole (LOTRIMIN) 1 % cream Apply to affected area 2 times daily for 4 weeks, or for 1 week after lesions have healed. 04/02/18   Joy, Shawn C, PA-C  cyclobenzaprine (FLEXERIL) 5 MG tablet Take 1 tablet (5 mg total) by mouth 3 (three) times daily as needed for muscle spasms. 12/12/17   Mayo, Pete Pelt, MD  gabapentin (NEURONTIN) 300 MG capsule Take 1 capsule (300 mg total) by mouth 3 (three) times daily as needed. 12/12/17   Mayo, Pete Pelt, MD  naproxen (NAPROSYN) 250 MG tablet Take 1 tablet (250 mg total) by mouth 2 (two) times daily with a meal. 03/03/17   Waynetta Pean, PA-C  ondansetron (ZOFRAN-ODT) 4 MG disintegrating tablet Take 1 tablet (4 mg total) by mouth every 8 (eight) hours as needed for nausea or  vomiting. 03/27/18   Lovenia Kim, MD  tetracycline (ACHROMYCIN,SUMYCIN) 250 MG capsule TAKE 1 CAPSULE(250 MG) BY MOUTH TWICE DAILY BEFORE A MEAL 04/18/18   Everrett Coombe, MD    Family History Family History  Problem Relation Age of Onset  . Asthma Mother     Social History Social History   Tobacco Use  . Smoking status: Never Smoker  . Smokeless tobacco: Never Used  Substance Use Topics  . Alcohol use: No  . Drug use: No    Comment: Denies recreational drug use.     Allergies   Patient has no known allergies.   Review of Systems Review of Systems  Constitutional: Negative for fever and malaise/fatigue.  HENT: Negative for sore throat.    Eyes: Negative for visual disturbance.  Respiratory: Positive for shortness of breath.   Cardiovascular: Positive for chest pain. Negative for syncope and near-syncope.  Gastrointestinal: Negative for nausea.  Genitourinary: Negative for frequency.  Musculoskeletal: Negative for neck pain.  Skin: Negative for rash.  Neurological: Positive for dizziness.     Physical Exam Updated Vital Signs BP (!) 133/93   Pulse (!) 102   Temp 98.8 F (37.1 C) (Oral)   Resp (!) 21   Ht 6\' 1"  (1.854 m)   Wt 85.3 kg   SpO2 100%   BMI 24.80 kg/m   Physical Exam  Constitutional: He appears well-developed and well-nourished.  HENT:  Head: Normocephalic and atraumatic.  Eyes: Conjunctivae are normal.  Neck: Neck supple.  Cardiovascular: Normal rate and regular rhythm.  No murmur heard. Pulmonary/Chest: Effort normal and breath sounds normal. No respiratory distress.  Abdominal: Soft. There is no tenderness.  Musculoskeletal: He exhibits no edema, tenderness or deformity.  Neurological: He is alert. He has normal strength. No cranial nerve deficit or sensory deficit. GCS eye subscore is 4. GCS verbal subscore is 5. GCS motor subscore is 6.  Skin: Skin is warm and dry.  Psychiatric: He has a normal mood and affect.  Nursing note and vitals reviewed.    ED Treatments / Results  Labs (all labs ordered are listed, but only abnormal results are displayed) Labs Reviewed  BASIC METABOLIC PANEL - Abnormal; Notable for the following components:      Result Value   Glucose, Bld 114 (*)    Creatinine, Ser 1.26 (*)    All other components within normal limits  CBC  I-STAT TROPONIN, ED  I-STAT TROPONIN, ED    EKG None  Radiology Dg Chest 2 View  Result Date: 08/17/2018 CLINICAL DATA:  Chest pain, shortness of breath EXAM: CHEST - 2 VIEW COMPARISON:  06/03/2016 FINDINGS: The heart size and mediastinal contours are within normal limits. Both lungs are clear. The visualized skeletal  structures are unremarkable. IMPRESSION: No active cardiopulmonary disease. Electronically Signed   By: Kathreen Devoid   On: 08/17/2018 09:32    Procedures Procedures (including critical care time)  Medications Ordered in ED Medications - No data to display   Initial Impression / Assessment and Plan / ED Course  I have reviewed the triage vital signs and the nursing notes.  Pertinent labs & imaging results that were available during my care of the patient were reviewed by me and considered in my medical decision making (see chart for details).  Clinical Course as of Aug 17 1530  Thu Aug 17, 2018  0841 Last cardiology note 10/17 - Echo-Study Conclusions  - Left ventricle: The cavity size was normal. Systolic function was normal. The estimated  ejection fraction was in the range of 55% to 60%. Wall motion was normal; there were no regional wall motion abnormalities. Left ventricular diastolic function parameters were normal. - Aortic valve: Trileaflet; normal thickness leaflets. There was no regurgitation. - Aortic root: The aortic root was normal in size. - Mitral valve: Structurally normal valve. There was no regurgitation. - Left atrium: The atrium was normal in size. - Right ventricle: The cavity size was normal. Wall thickness was normal. Systolic function was normal. - Right atrium: The atrium was normal in size. - Tricuspid valve: There was no regurgitation. - Pulmonary arteries: Systolic pressure was within the normal range. - Inferior vena cava: The vessel was normal in size. - Pericardium, extracardiac: There was no pericardial effusion.  Impressions:  - Normal study.    CXR-Nuclear stress EF: 51%.  There was no ST segment deviation noted during stress.  This is a low risk study.  The left ventricular ejection fraction is mildly decreased (45-54%).  1. EF 51%, mild diffuse hypokinesis.  2. Normal perfusion images, no evidence for  ischemia or infarction.  3. Normal stress ECG.  4. Mild chest pain during exercise.   Low risk study. Would consider echo to confirm mildly decreased EF.    [MB]  0900 EKG here is sinus tachycardia with a rate of 108 normal intervals no acute ST-T changes.   [MB]  9470 Reviewed results with patient.  He understands he needs to call his cardiologist to get followed up.   [MB]    Clinical Course User Index [MB] Hayden Rasmussen, MD     Final Clinical Impressions(s) / ED Diagnoses   Final diagnoses:  Atypical chest pain  Shortness of breath    ED Discharge Orders    None       Hayden Rasmussen, MD 08/17/18 [phone]

## 2018-08-22 ENCOUNTER — Other Ambulatory Visit: Payer: Self-pay | Admitting: Student in an Organized Health Care Education/Training Program

## 2018-08-22 DIAGNOSIS — L700 Acne vulgaris: Secondary | ICD-10-CM

## 2018-08-23 ENCOUNTER — Encounter (HOSPITAL_COMMUNITY): Payer: Self-pay | Admitting: Nurse Practitioner

## 2018-08-23 ENCOUNTER — Ambulatory Visit (HOSPITAL_COMMUNITY)
Admission: RE | Admit: 2018-08-23 | Discharge: 2018-08-23 | Disposition: A | Discharge status: Home / Self Care | Payer: 59 | Source: Ambulatory Visit | Attending: Nurse Practitioner | Admitting: Nurse Practitioner

## 2018-08-23 VITALS — BP 134/90 | HR 94 | Ht 73.0 in | Wt 180.0 lb

## 2018-08-23 DIAGNOSIS — R0789 Other chest pain: Secondary | ICD-10-CM | POA: Insufficient documentation

## 2018-08-23 DIAGNOSIS — F419 Anxiety disorder, unspecified: Secondary | ICD-10-CM | POA: Insufficient documentation

## 2018-08-23 DIAGNOSIS — I480 Paroxysmal atrial fibrillation: Secondary | ICD-10-CM | POA: Diagnosis not present

## 2018-08-23 DIAGNOSIS — J45909 Unspecified asthma, uncomplicated: Secondary | ICD-10-CM | POA: Insufficient documentation

## 2018-08-23 NOTE — Progress Notes (Signed)
Primary Care Physician: Everrett Coombe, MD Referring Physician: [facility]   Juan Rogers is a 42 y.o. male with a h/o acne, asthma, that was in the ER recently for symptomatic afib with RVR. 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 He started having chest tightness, diaphoresis, N/V and then was aware of irregular heart beat. He presented to the ER was cardioverted to SR.Labs unremarkable. TSH/Troponins negative. He has a chadsvasc score of 0, was given xarelto in the  ER but just picked up from the drug store today and plans to start tonight. He mentioned he had some aching in hs chest and left arm last pm, kept him from sleeping well. Has not noticed any this am.  Lifestyle issues reviewed with pt and he denies any alcohol use, excessive caffeine use, snoring, tobacco, positive for  stress from a 58 yo teenage son, normal weight.Wil have some asthma symptoms if very exertional but mostly has grown out of it.  Pt asked to be seen 9/21 for shortness of breath walking up an incline at work associated with some chest tightness. It resolved within a few minutes. His work sent him home. In the office, he is complaining of chest tightness most of the time since ER visit and cardioversion. He also says that he hears a gurgling in his left upper chest. He was walked for about 5 mins in the office without desaturation, inappropriate tachycardia, or unusual short ness of breath. He is tender on palpation left upper chest. Sent for CXR and negative. Will move up stress test.He is in no distress currently. EKG NSR, normal EKG.  F/u in afib clinic as he was seen  for chest pain in the ER, 12/5.  No afib. He states that he keeps chest pain 24/7.He has similar symptoms in the remote past(2017) when I initially saw him with onset of afib. Echo and stress test in recent past normal. Pt states that he feels aggravation, anxiety, frustration often. He took some  of he wife's OxyContin, tylenol,nyquil the other night to feel better with less aggravation. He states he was not trying to hurt himself. He does admit to stress. Denies wanting to see a counselor, is agreeable to see his PCP.  Today, he denies symptoms of palpitations,  shortness of breath, orthopnea, PND, lower extremity edema, dizziness, presyncope, syncope, or neurologic sequela.Positive for recent palpitations, and chest discomfort.  Past Medical History:  Diagnosis Date  . Acne   . Asthma    Past Surgical History:  Procedure Laterality Date  . FOOT SURGERY Right     Current Outpatient Medications  Medication Sig Dispense Refill  . tetracycline (ACHROMYCIN,SUMYCIN) 250 MG capsule TAKE 1 CAPSULE(250 MG) BY MOUTH TWICE DAILY BEFORE A MEAL (Patient taking differently: Take 250 mg by mouth 2 (two) times daily before a meal. ) 180 capsule 0   No current facility-administered medications for this encounter.     No Known Allergies  Social History   Socioeconomic History  . Marital status: Married    Spouse name: Not on file  . Number of children: Not on file  . Years of education: Not on file  . Highest education level: Not on file  Occupational History  . Not on file  Social Needs  . Financial resource strain: Not on file  . Food insecurity:    Worry: Not on file    Inability: Not on file  .  Transportation needs:    Medical: Not on file    Non-medical: Not on file  Tobacco Use  . Smoking status: Never Smoker  . Smokeless tobacco: Never Used  Substance and Sexual Activity  . Alcohol use: No  . Drug use: No    Comment: Denies recreational drug use.  Marland Kitchen Sexual activity: Not on file  Lifestyle  . Physical activity:    Days per week: Not on file    Minutes per session: Not on file  . Stress: Not on file  Relationships  . Social connections:    Talks on phone: Not on file    Gets together: Not on file    Attends religious service: Not on file    Active member of  club or organization: Not on file    Attends meetings of clubs or organizations: Not on file    Relationship status: Not on file  . Intimate partner violence:    Fear of current or ex partner: Not on file    Emotionally abused: Not on file    Physically abused: Not on file    Forced sexual activity: Not on file  Other Topics Concern  . Not on file  Social History Narrative   Works for [employer]. Married.    Family History  Problem Relation Age of Onset  . Asthma Mother     ROS- All systems are reviewed and negative except as per the HPI above  Physical Exam: Vitals:   08/23/18 1424  BP: 134/90  Pulse: 94  SpO2: 98%  Weight: 81.6 kg  Height: 6\' 1"  (1.854 m)    GEN- The patient is well appearing, alert and oriented x 3 today.   Head- normocephalic, atraumatic Eyes-  Sclera clear, conjunctiva pink Ears- hearing intact Oropharynx- clear Neck- supple, no JVP Lymph- no cervical lymphadenopathy Lungs- Clear to ausculation bilaterally, normal work of breathing Heart- Regular rate and rhythm, no murmurs, rubs or gallops, PMI not laterally displaced. Tender on palpation left upper chest GI- soft, NT, ND, + BS Extremities- no clubbing, cyanosis, or edema MS- no significant deformity or atrophy Skin- no rash or lesion Psych- euthymic mood, full affect Neuro- strength and sensation are intact  EKG- NSR, pr int 118 ms, qrs int 82 ms, qtc 405 ms Epic records reviewed CXR-FINDINGS:12/5- IMPRESSION: No active cardiopulmonary disease.    Assessment and Plan: 1.PAF S/p cardioversion 2017 Has been quiet, no reoccurence This patients CHA2DS2-VASc Score is 0    2.Atypical Chest pain He states that it has been present for years almost daily Normal echo/ stress test in the past and normal recent CXR He does admit to feelings of anxiety/frustration/aggravation/stress His chest pain may be somatic  He is willing to see his PCP for these feelings, he does not think he  needs to see a  Mental healthcounselor  Called and left a message with PCP that he is needing appointment  Butch Penny C. Jyles Sontag, [facility] [address] [place], [place] [address] [phone]

## 2018-08-23 NOTE — Patient Instructions (Signed)
[facility] scheduling will call you with an appointment.

## 2018-08-24 ENCOUNTER — Other Ambulatory Visit: Payer: Self-pay | Admitting: Student in an Organized Health Care Education/Training Program

## 2018-08-24 DIAGNOSIS — L700 Acne vulgaris: Secondary | ICD-10-CM

## 2018-11-02 ENCOUNTER — Encounter (HOSPITAL_COMMUNITY): Payer: Self-pay

## 2018-11-02 ENCOUNTER — Other Ambulatory Visit: Payer: Self-pay

## 2018-11-02 ENCOUNTER — Emergency Department (HOSPITAL_COMMUNITY): Payer: Self-pay

## 2018-11-02 ENCOUNTER — Emergency Department (HOSPITAL_COMMUNITY)
Admission: EM | Admit: 2018-11-02 | Discharge: 2018-11-02 | Disposition: A | Discharge status: Home / Self Care | Payer: Self-pay | Attending: Emergency Medicine | Admitting: Emergency Medicine

## 2018-11-02 DIAGNOSIS — Z79899 Other long term (current) drug therapy: Secondary | ICD-10-CM | POA: Insufficient documentation

## 2018-11-02 DIAGNOSIS — Y929 Unspecified place or not applicable: Secondary | ICD-10-CM | POA: Insufficient documentation

## 2018-11-02 DIAGNOSIS — S46012A Strain of muscle(s) and tendon(s) of the rotator cuff of left shoulder, initial encounter: Secondary | ICD-10-CM | POA: Insufficient documentation

## 2018-11-02 DIAGNOSIS — S46912A Strain of unspecified muscle, fascia and tendon at shoulder and upper arm level, left arm, initial encounter: Secondary | ICD-10-CM

## 2018-11-02 DIAGNOSIS — X500XXA Overexertion from strenuous movement or load, initial encounter: Secondary | ICD-10-CM | POA: Insufficient documentation

## 2018-11-02 DIAGNOSIS — Y999 Unspecified external cause status: Secondary | ICD-10-CM | POA: Insufficient documentation

## 2018-11-02 DIAGNOSIS — J45909 Unspecified asthma, uncomplicated: Secondary | ICD-10-CM | POA: Insufficient documentation

## 2018-11-02 DIAGNOSIS — Y939 Activity, unspecified: Secondary | ICD-10-CM | POA: Insufficient documentation

## 2018-11-02 MED ORDER — CYCLOBENZAPRINE HCL 10 MG PO TABS: 10.0000 mg | ORAL_TABLET | Freq: Two times a day (BID) | ORAL | 0 refills | Status: DC | PRN

## 2018-11-02 MED ORDER — CYCLOBENZAPRINE HCL 10 MG PO TABS
10.0000 mg | ORAL_TABLET | Freq: Once | ORAL | Status: AC
  Administered 2018-11-02: 10 mg via ORAL
  Filled 2018-11-02: qty 1

## 2018-11-02 MED ORDER — IBUPROFEN 600 MG PO TABS: 600.0000 mg | ORAL_TABLET | Freq: Four times a day (QID) | ORAL | 0 refills | Status: DC | PRN

## 2018-11-02 MED ORDER — KETOROLAC TROMETHAMINE 30 MG/ML IJ SOLN
30.0000 mg | Freq: Once | INTRAMUSCULAR | Status: AC
  Administered 2018-11-02: 30 mg via INTRAMUSCULAR
  Filled 2018-11-02: qty 1

## 2018-11-02 NOTE — ED Notes (Signed)
Pt verbalized understanding of d/c instructions and has no further questions, VSS, NAD.  

## 2018-11-02 NOTE — ED Provider Notes (Signed)
[facility] EMERGENC[facility] DEPARTMENT Provider Note   CSN: [id number] Arrival date & time: 11/02/18  4239    History   Chief Complaint Chief Complaint  Patient presents with  . Shoulder Pain    HPI Juan Rogers is a 43 y.o. male.     The history is provided by the patient and medical records. No language interpreter was used.  Shoulder Pain     43 year old male without any significant cardiac history presenting for evaluation of left shoulder pain.  Patient report gradual onset of persistent sharp shooting pain to the back of his left shoulder radiates to his chest and down her left arm ongoing for the past 4 days.  Pain worsening with movement.  At its worse, patient endorses shortness of breath and tingling sensation down his left arm.  Rates pain is moderate currently.  2 days ago while walking, pain became intense, he did call EMS who evaluated him at that time and states that everything was normal.  He has been trying icy hot, and ibuprofen at home without adequate relief.  No report of fever chills no lightheadedness or dizziness no productive cough, hemoptysis, abdominal pain.  He is right-hand dominant.  He denies any recent heavy lifting or strength activities aside from playing with his 48-year-old daughter.  He denies any alcohol use or tobacco use.  He did report several years prior having a syncopal episode when he was found to be in atrial fibrillation requiring cardioversion.  Past Medical History:  Diagnosis Date  . Acne   . Asthma     Patient Active Problem List   Diagnosis Date Noted  . Viral gastroenteritis 04/03/2018  . Acute bilateral low back pain 12/12/2017  . Acne vulgaris 07/19/2017  . Biceps tendinopathy, right 01/19/2017  . Nevus 10/19/2012  . 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 FOOT SURGERY Right         Home Medications    Prior to Admission medications   Medication Sig  Start Date End Date Taking? Authorizing Provider  tetracycline (ACHROMYCIN,SUMYCIN) 250 MG capsule TAKE 1 CAPSULE(250 MG) BY MOUTH TWICE DAILY BEFORE A MEAL 08/25/18   Everrett Coombe, MD  tetracycline (ACHROMYCIN,SUMYCIN) 250 MG capsule TAKE 1 CAPSULE(250 MG) BY MOUTH TWICE DAILY BEFORE A MEAL 08/28/18   Everrett Coombe, MD    Family History Family History  Problem Relation Age of Onset  . Asthma Mother     Social History Social History   Tobacco Use  . Smoking status: Never Smoker  . Smokeless tobacco: Never Used  Substance Use Topics  . Alcohol use: No  . Drug use: No    Comment: Denies recreational drug use.     Allergies   Patient has no known allergies.   Review of Systems Review of Systems  All other systems reviewed and are negative.    Physical Exam Updated Vital Signs BP (!) 141/99 (BP Location: Right Arm)   Pulse 79   Temp 97.6 F (36.4 C) (Oral)   Resp 18   Ht 6\' 1"  (1.854 m)   Wt 74.8 kg   SpO2 100%   BMI 21.77 kg/m   Physical Exam Vitals signs and nursing note reviewed.  Constitutional:      General: He is not in acute distress.    Appearance: He is well-developed.  HENT:     Head: Atraumatic.  Eyes:     Conjunctiva/sclera: Conjunctivae normal.  Neck:  Musculoskeletal: Neck supple.  Cardiovascular:     Rate and Rhythm: Normal rate and regular rhythm.     Pulses: Normal pulses.     Heart sounds: Normal heart sounds.  Pulmonary:     Effort: Pulmonary effort is normal.     Breath sounds: Normal breath sounds.  Abdominal:     Palpations: Abdomen is soft.  Musculoskeletal:        General: Tenderness (Left shoulder: Tenderness to the posterior shoulder along the edge of the scapula with surrounding muscle tightness.  Decreased shoulder range of motion secondary to pain with active movement.  No obvious deformity.  No overlying skin changes.) present.     Comments: Left arm with intact radial pulse, arm compartment soft.  Normal grip strength.    Skin:    Findings: No rash.  Neurological:     Mental Status: He is alert.      ED Treatments / Results  Labs (all labs ordered are listed, but only abnormal results are displayed) Labs Reviewed - No data to display  EKG None  ED ECG REPORT   Date: 11/02/2018  Rate: 93  Rhythm: normal sinus rhythm and sinus arrhythmia  QRS Axis: normal  Intervals: normal  ST/T Wave abnormalities: normal  Conduction Disutrbances:none  Narrative Interpretation:   Old EKG Reviewed: unchanged  I have personally reviewed the EKG tracing and agree with the computerized printout as noted.   Radiology Dg Chest 2 View  Result Date: 11/02/2018 CLINICAL DATA:  Left shoulder pain EXAM: CHEST - 2 VIEW COMPARISON:  08/17/2018 FINDINGS: The heart size and mediastinal contours are within normal limits. Both lungs are clear. The visualized skeletal structures are unremarkable. IMPRESSION: No acute abnormality of the lungs.  No focal airspace opacity. Electronically Signed   By: Eddie Candle M.D.   On: 11/02/2018 08:35    Procedures Procedures (including critical care time)  Medications Ordered in ED Medications  ketorolac (TORADOL) 30 MG/ML injection 30 mg (30 mg Intramuscular Given 11/02/18 0800)  cyclobenzaprine (FLEXERIL) tablet 10 mg (10 mg Oral Given 11/02/18 0759)     Initial Impression / Assessment and Plan / ED Course  I have reviewed the triage vital signs and the nursing notes.  Pertinent labs & imaging results that were available during my care of the patient were reviewed by me and considered in my medical decision making (see chart for details).        BP (!) 141/99 (BP Location: Right Arm)   Pulse 79   Temp 97.6 F (36.4 C) (Oral)   Resp 18   Ht 6\' 1"  (1.854 m)   Wt 79.4 kg   SpO2 100%   BMI 23.09 kg/m    Final Clinical Impressions(s) / ED Diagnoses   Final diagnoses:  Strain of left shoulder, initial encounter    ED Discharge Orders         Ordered     cyclobenzaprine (FLEXERIL) 10 MG tablet  2 times daily PRN     11/02/18 0855    ibuprofen (ADVIL,MOTRIN) 600 MG tablet  Every 6 hours PRN     11/02/18 0855         7:56 AM Patient here with left posterior shoulder pain likely musculoskeletal.  Limited range of motion secondary to pain and pain is palpable.  I have low suspicion for ACS.  8:54 AM EKG unremarkable, chest x-ray without any concerning feature.  Patient received Toradol and Flexeril.  Report mild improvement.  Recommend follow-up with orthopedic  if no improvement however, encourage rice therapy.  Return precaution given.    Domenic Moras, PA-C 11/02/18 0932    Charlesetta Shanks, MD 11/02/18 [phone]

## 2018-11-02 NOTE — ED Notes (Signed)
Patient transported to X-ray 

## 2018-11-02 NOTE — ED Triage Notes (Addendum)
Pt from home with complaint of pain in the left scapular area radiating down the left arm and into the left chest x 2 days. Worse with movement and palpation posteriorly. States that he was having shob 2 nights ago and ems came out to check patient but he declined transport. Hx of a-fib 2 years ago.

## 2019-09-09 ENCOUNTER — Other Ambulatory Visit: Payer: Self-pay

## 2019-09-09 ENCOUNTER — Ambulatory Visit (HOSPITAL_COMMUNITY)
Admission: EM | Admit: 2019-09-09 | Discharge: 2019-09-09 | Disposition: A | Discharge status: Home / Self Care | Payer: Self-pay | Attending: Family Medicine | Admitting: Family Medicine

## 2019-09-09 ENCOUNTER — Encounter (HOSPITAL_COMMUNITY): Payer: Self-pay

## 2019-09-09 DIAGNOSIS — B078 Other viral warts: Secondary | ICD-10-CM

## 2019-09-09 NOTE — Discharge Instructions (Signed)
This appears to be typical skin warm. You can try over-the-counter medication to try to get rid of the wart.  Compound W is a good option. If this does not help you can have the wart frozen off by dermatology.  I will put a referral in for you. Follow up as needed for continued or worsening symptoms

## 2019-09-09 NOTE — ED Triage Notes (Signed)
Pt presents to the UC with a mass behind his left ear x 1 year aprox. Pt states the mass started growing up 2 months ago.

## 2019-09-09 NOTE — ED Provider Notes (Signed)
Juan Rogers    CSN: [id number] Arrival date & time: 09/09/19  1007      History   Chief Complaint Chief Complaint  Patient presents with  . Mass    HPI Juan Rogers is a 43 y.o. male.   Patient is a 43 year old male with past medical history of acne and asthma.  He presents today with mass to left lateral neck area.  This is been present and worsening for the past year.  Over the past couple weeks he has had pain to the area but he is also been trying to squeeze the area to get drainage out.  Reports area is just bleeding.  Denies any itchiness or ever having any prior moles in the area.  No fevers, chills.  ROS per HPI      Past Medical History:  Diagnosis Date  . Acne   . Asthma     Patient Active Problem List   Diagnosis Date Noted  . Viral gastroenteritis 04/03/2018  . Acute bilateral low back pain 12/12/2017  . Acne vulgaris 07/19/2017  . Biceps tendinopathy, right 01/19/2017  . Nevus 10/19/2012  . 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 FOOT SURGERY Right        Home Medications    Prior to Admission medications   Medication Sig Start Date End Date Taking? Authorizing Provider  cyclobenzaprine (FLEXERIL) 10 MG tablet Take 1 tablet (10 mg total) by mouth 2 (two) times daily as needed for muscle spasms. 11/02/18   Domenic Moras, PA-C  ibuprofen (ADVIL,MOTRIN) 600 MG tablet Take 1 tablet (600 mg total) by mouth every 6 (six) hours as needed. 11/02/18   Domenic Moras, PA-C  tetracycline (ACHROMYCIN,SUMYCIN) 250 MG capsule TAKE 1 CAPSULE(250 MG) BY MOUTH TWICE DAILY BEFORE A MEAL 08/25/18   Everrett Coombe, MD  tetracycline (ACHROMYCIN,SUMYCIN) 250 MG capsule TAKE 1 CAPSULE(250 MG) BY MOUTH TWICE DAILY BEFORE A MEAL 08/28/18   Everrett Coombe, MD    Family History Family History  Problem Relation Age of Onset  . Asthma Mother     Social History Social History   Tobacco Use  . Smoking status: Never  Smoker  . Smokeless tobacco: Never Used  Substance Use Topics  . Alcohol use: No  . Drug use: No    Comment: Denies recreational drug use.     Allergies   Patient has no known allergies.   Review of Systems Review of Systems   Physical Exam Triage Vital Signs ED Triage Vitals  Enc Vitals Group     BP 09/09/19 1023 140/90     Pulse Rate 09/09/19 1023 89     Resp 09/09/19 1023 16     Temp 09/09/19 1023 98.5 F (36.9 C)     Temp Source 09/09/19 1023 Oral     SpO2 09/09/19 1023 99 %     Weight --      Height --      Head Circumference --      Peak Flow --      Pain Score 09/09/19 1022 1     Pain Loc --      Pain Edu? --      Excl. in [place]? --    No data found.  Updated Vital Signs BP 140/90 (BP Location: Left Arm)   Pulse 89   Temp 98.5 F (36.9 C) (Oral)   Resp 16   SpO2 99%   Visual Acuity Right  Eye Distance:   Left Eye Distance:   Bilateral Distance:    Right Eye Near:   Left Eye Near:    Bilateral Near:     Physical Exam Vitals and nursing note reviewed.  Constitutional:      Appearance: Normal appearance.  HENT:     Head: Normocephalic and atraumatic.     Nose: Nose normal.  Eyes:     Conjunctiva/sclera: Conjunctivae normal.  Pulmonary:     Effort: Pulmonary effort is normal.  Musculoskeletal:        General: Normal range of motion.     Cervical back: Normal range of motion.  Skin:    General: Skin is warm and dry.  Neurological:     Mental Status: He is alert.  Psychiatric:        Mood and Affect: Mood normal.        UC Treatments / Results  Labs (all labs ordered are listed, but only abnormal results are displayed) Labs Reviewed - No data to display  EKG   Radiology No results found.  Procedures Procedures (including critical care time)  Medications Ordered in UC Medications - No data to display  Initial Impression / Assessment and Plan / UC Course  I have reviewed the triage vital signs and the nursing  notes.  Pertinent labs & imaging results that were available during my care of the patient were reviewed by me and considered in my medical decision making (see chart for details).     Common wart-recommend over-the-counter medication for treatment And was a referral put in for dermatology for possible cryotherapy Final Clinical Impressions(s) / UC Diagnoses   Final diagnoses:  Common wart     Discharge Instructions     This appears to be typical skin warm. You can try over-the-counter medication to try to get rid of the wart.  Compound W is a good option. If this does not help you can have the wart frozen off by dermatology.  I will put a referral in for you. Follow up as needed for continued or worsening symptoms     ED Prescriptions    None     PDMP not reviewed this encounter.   Loura Halt A, NP 09/09/19 1045

## 2019-10-27 ENCOUNTER — Encounter (HOSPITAL_COMMUNITY): Payer: Self-pay

## 2019-10-27 ENCOUNTER — Ambulatory Visit (HOSPITAL_COMMUNITY)
Admission: EM | Admit: 2019-10-27 | Discharge: 2019-10-27 | Disposition: A | Discharge status: Home / Self Care | Payer: Self-pay | Attending: Urgent Care | Admitting: Urgent Care

## 2019-10-27 ENCOUNTER — Other Ambulatory Visit: Payer: Self-pay

## 2019-10-27 DIAGNOSIS — L708 Other acne: Secondary | ICD-10-CM

## 2019-10-27 DIAGNOSIS — L700 Acne vulgaris: Secondary | ICD-10-CM

## 2019-10-27 MED ORDER — TETRACYCLINE HCL 250 MG PO CAPS: ORAL_CAPSULE | ORAL | 1 refills | Status: DC

## 2019-10-27 NOTE — ED Triage Notes (Signed)
Patient presents to Urgent Care with complaints of needing a refill on his tetracycline for his acne since running out. Patient reports he takes 250 mg, has the bottle with him.

## 2019-10-27 NOTE — ED Provider Notes (Signed)
[facility]   MRN: [id number] DOB: 07-02-1976  Subjective:   Juan Rogers is a 44 y.o. male presenting for recurrence of inflammatory acne about the left side of his face.  Patient states that he has done very well with 250 mg twice daily of tetracycline.  He was refused refill with his previous PCP as he has not been seen there in a while.  No current facility-administered medications for this encounter.  Current Outpatient Medications:  .  cyclobenzaprine (FLEXERIL) 10 MG tablet, Take 1 tablet (10 mg total) by mouth 2 (two) times daily as needed for muscle spasms., Disp: 20 tablet, Rfl: 0 .  ibuprofen (ADVIL,MOTRIN) 600 MG tablet, Take 1 tablet (600 mg total) by mouth every 6 (six) hours as needed., Disp: 30 tablet, Rfl: 0 .  tetracycline (ACHROMYCIN,SUMYCIN) 250 MG capsule, TAKE 1 CAPSULE(250 MG) BY MOUTH TWICE DAILY BEFORE A MEAL, Disp: 180 capsule, Rfl: 0 .  tetracycline (ACHROMYCIN,SUMYCIN) 250 MG capsule, TAKE 1 CAPSULE(250 MG) BY MOUTH TWICE DAILY BEFORE A MEAL, Disp: 180 capsule, Rfl: 0   No Known Allergies  Past Medical History:  Diagnosis Date  . Acne   . Asthma      Past Surgical History:  Procedure Laterality Date  . FOOT SURGERY Right     Family History  Problem Relation Age of Onset  . Asthma Mother     Social History   Tobacco Use  . Smoking status: Never Smoker  . Smokeless tobacco: Never Used  Substance Use Topics  . Alcohol use: No  . Drug use: No    Comment: Denies recreational drug use.    ROS   Objective:   Vitals: BP 127/87 (BP Location: Left Arm)   Pulse 82   Temp 97.9 F (36.6 C) (Oral)   Resp 16   SpO2 100%   Physical Exam Constitutional:      General: He is not in acute distress.    Appearance: Normal appearance. He is well-developed and normal weight. He is not ill-appearing, toxic-appearing or diaphoretic.  HENT:     Head: Normocephalic and atraumatic.     Comments: Inflammatory acne outbreak on left side of  medial cheek.    Right Ear: External ear normal.     Left Ear: External ear normal.     Nose: Nose normal.     Mouth/Throat:     Pharynx: Oropharynx is clear.  Eyes:     General: No scleral icterus.       Right eye: No discharge.        Left eye: No discharge.     Extraocular Movements: Extraocular movements intact.     Pupils: Pupils are equal, round, and reactive to light.  Cardiovascular:     Rate and Rhythm: Normal rate.  Pulmonary:     Effort: Pulmonary effort is normal.  Musculoskeletal:     Cervical back: Normal range of motion.  Neurological:     Mental Status: He is alert and oriented to person, place, and time.  Psychiatric:        Mood and Affect: Mood normal.        Behavior: Behavior normal.        Thought Content: Thought content normal.        Judgment: Judgment normal.      Assessment and Plan :   1. Inflammatory acne   2. Acne vulgaris     Refilled tetracycline for patient with 60 tablets and 1 refill. Recommended f/u appt  with his PCP, dermatologist. Counseled patient on potential for adverse effects with medications prescribed/recommended today, ER and return-to-clinic precautions discussed, patient verbalized understanding.    Jaynee Eagles, PA-C 10/27/19 1106

## 2020-02-21 ENCOUNTER — Encounter (HOSPITAL_COMMUNITY): Payer: Self-pay | Admitting: Emergency Medicine

## 2020-02-21 ENCOUNTER — Emergency Department (HOSPITAL_COMMUNITY)
Admission: EM | Admit: 2020-02-21 | Discharge: 2020-02-21 | Disposition: A | Discharge status: Home / Self Care | Payer: Self-pay | Attending: Emergency Medicine | Admitting: Emergency Medicine

## 2020-02-21 DIAGNOSIS — G5601 Carpal tunnel syndrome, right upper limb: Secondary | ICD-10-CM | POA: Insufficient documentation

## 2020-02-21 DIAGNOSIS — R202 Paresthesia of skin: Secondary | ICD-10-CM | POA: Insufficient documentation

## 2020-02-21 DIAGNOSIS — J45909 Unspecified asthma, uncomplicated: Secondary | ICD-10-CM | POA: Insufficient documentation

## 2020-02-21 MED ORDER — KETOROLAC TROMETHAMINE 15 MG/ML IJ SOLN
15.0000 mg | Freq: Once | INTRAMUSCULAR | Status: AC
  Administered 2020-02-21: 15 mg via INTRAMUSCULAR
  Filled 2020-02-21: qty 1

## 2020-02-21 MED ORDER — PREDNISONE 50 MG PO TABS: 50.0000 mg | ORAL_TABLET | Freq: Every day | ORAL | 0 refills | Status: AC

## 2020-02-21 MED ORDER — DICLOFENAC SODIUM 1 % EX GEL: 2.0000 g | Freq: Four times a day (QID) | CUTANEOUS | 0 refills | Status: AC

## 2020-02-21 NOTE — ED Triage Notes (Signed)
Patient c/o pain to right wrist without injury x 1 week.

## 2020-02-21 NOTE — Discharge Instructions (Addendum)
Continue wearing the brace, especially at night. Use ice to help with pain and swelling. Take prednisone as prescribed.  Take entire course. Do not take other anti-inflammatory such as ibuprofen, Advil, aspirin, naproxen.  You may supplement with Tylenol as needed for pain. Use Voltaren gel to help with pain and swelling. Follow-up with Dr. Amedeo Plenty, the hand doctor listed below.  You may also need to follow-up with neurology (office information listed below) for nerve studies as part of your workup.

## 2020-02-21 NOTE — ED Provider Notes (Signed)
[facility] DEPT Provider Note   CSN: [id number] Arrival date & time: 02/21/20  1233     History Chief Complaint  Patient presents with  . Wrist Pain    Juan Rogers is a 44 y.o. male presenting for evaluation of right wrist pain.  Patient states for the past week, he has had worsening pain in his right wrist.  Pain is on the palmar aspect at the base of his palm.  He also reports tingling in his thumb, index, and middle finger.  He denies history of similar symptoms.  He works a job in which he is a little lifting and manipulation of his hand/wrist.  He is right-handed.  He has been taking 800 mg of ibuprofen 2-3 times a day without improvement of symptoms.  He has also been wearing a wrist brace without improvement.  No symptoms on the left side.  He denies fall, trauma, or injury.  He denies fevers, chills, redness, or swelling of the joint.  He has no other medical problems, takes medications daily.  HPI     Past Medical History:  Diagnosis Date  . Acne   . Asthma     Patient Active Problem List   Diagnosis Date Noted  . Viral gastroenteritis 04/03/2018  . Acute bilateral low back pain 12/12/2017  . Acne vulgaris 07/19/2017  . Biceps tendinopathy, right 01/19/2017  . Nevus 10/19/2012  . 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 FOOT SURGERY Right        Family History  Problem Relation Age of Onset  . Asthma Mother     Social History   Tobacco Use  . Smoking status: Never Smoker  . Smokeless tobacco: Never Used  Vaping Use  . Vaping Use: Never used  Substance Use Topics  . Alcohol use: No  . Drug use: No    Comment: Denies recreational drug use.    Home Medications Prior to Admission medications   Medication Sig Start Date End Date Taking? Authorizing Provider  cyclobenzaprine (FLEXERIL) 10 MG tablet Take 1 tablet (10 mg total) by mouth 2 (two) times daily as needed for  muscle spasms. 11/02/18   Domenic Moras, PA-C  diclofenac Sodium (VOLTAREN) 1 % GEL Apply 2 g topically 4 (four) times daily. 02/21/20   Deatra Mcmahen, PA-C  ibuprofen (ADVIL,MOTRIN) 600 MG tablet Take 1 tablet (600 mg total) by mouth every 6 (six) hours as needed. 11/02/18   Domenic Moras, PA-C  predniSONE (DELTASONE) 50 MG tablet Take 1 tablet (50 mg total) by mouth daily for 5 days. 02/21/20 02/26/20  Anarely Nicholls, PA-C  tetracycline (SUMYCIN) 250 MG capsule TAKE 1 CAPSULE(250 MG) BY MOUTH TWICE DAILY BEFORE A MEAL 10/27/19   Jaynee Eagles, PA-C    Allergies    Patient has no known allergies.  Review of Systems   Review of Systems  Musculoskeletal: Positive for arthralgias (R wrist).  Neurological: Positive for numbness (tingling of thumb, index, and middle fingers).    Physical Exam Updated Vital Signs BP 135/88 (BP Location: Right Arm)   Pulse 78   Temp 98.1 F (36.7 C) (Oral)   Resp 15   SpO2 99%   Physical Exam Vitals and nursing note reviewed.  Constitutional:      General: He is not in acute distress.    Appearance: He is well-developed.     Comments: Appears nontoxic  HENT:     Head: Normocephalic and atraumatic.  Pulmonary:     Effort: Pulmonary effort is normal.  Abdominal:     General: There is no distension.  Musculoskeletal:        General: Normal range of motion.       Hands:     Cervical back: Normal range of motion.     Comments: Positive Tinel's on the right side.  No erythema, warmth, or swelling of the wrist.  No tenderness palpation of the dorsal wrist.  Pain with movement of the right wrist.  No tenderness palpation of the hand.  Good distal sensation and cap refill.  Skin:    General: Skin is warm.     Findings: No rash.  Neurological:     Mental Status: He is alert and oriented to person, place, and time.     ED Results / Procedures / Treatments   Labs (all labs ordered are listed, but only abnormal results are displayed) Labs Reviewed - No  data to display  EKG None  Radiology No results found.  Procedures Procedures (including critical care time)  Medications Ordered in ED Medications  ketorolac (TORADOL) 15 MG/ML injection 15 mg (15 mg Intramuscular Given 02/21/20 1311)    ED Course  I have reviewed the triage vital signs and the nursing notes.  Pertinent labs & imaging results that were available during my care of the patient were reviewed by me and considered in my medical decision making (see chart for details).    MDM Rules/Calculators/A&P                          Pt presenting for evaluation of right wrist pain with tingling of the thumb, first, and middle fingers.  Positive Tinel's.  Exam is consistent with carpal tunnel syndrome.  No erythema, warmth, or swelling to indicate septic joint.  No trauma or injury, as such I do not believe he needs emergent x-rays.  As patient has been taking NSAIDs without improvement, we will have him start on steroids.  We will have him continue with the wrist brace, discussed use of ice.  Encouraged follow-up with hand and.or neuro as needed for further evaluation. At this time, pt appears safe for d/c. Return precautions given. Pt states he understands and agrees to plan.   Final Clinical Impression(s) / ED Diagnoses Final diagnoses:  Carpal tunnel syndrome of right wrist    Rx / DC Orders ED Discharge Orders         Ordered    predniSONE (DELTASONE) 50 MG tablet  Daily     Discontinue  Reprint     02/21/20 1303    diclofenac Sodium (VOLTAREN) 1 % GEL  4 times daily     Discontinue  Reprint     02/21/20 [place], Dezmen Alcock, PA-C 02/21/20 1549    Wyvonnia Dusky, MD 02/21/20 1845

## 2020-03-09 ENCOUNTER — Emergency Department (HOSPITAL_COMMUNITY): Payer: Self-pay

## 2020-03-09 ENCOUNTER — Encounter (HOSPITAL_COMMUNITY): Payer: Self-pay | Admitting: Emergency Medicine

## 2020-03-09 ENCOUNTER — Emergency Department (HOSPITAL_COMMUNITY)
Admission: EM | Admit: 2020-03-09 | Discharge: 2020-03-09 | Disposition: A | Discharge status: Home / Self Care | Payer: Self-pay | Attending: Emergency Medicine | Admitting: Emergency Medicine

## 2020-03-09 ENCOUNTER — Other Ambulatory Visit: Payer: Self-pay

## 2020-03-09 DIAGNOSIS — R531 Weakness: Secondary | ICD-10-CM | POA: Insufficient documentation

## 2020-03-09 DIAGNOSIS — J45909 Unspecified asthma, uncomplicated: Secondary | ICD-10-CM | POA: Insufficient documentation

## 2020-03-09 DIAGNOSIS — G5601 Carpal tunnel syndrome, right upper limb: Secondary | ICD-10-CM | POA: Insufficient documentation

## 2020-03-09 DIAGNOSIS — R200 Anesthesia of skin: Secondary | ICD-10-CM | POA: Insufficient documentation

## 2020-03-09 NOTE — ED Triage Notes (Signed)
Per pt, states he injured his right wrist about 3 weeks-states he woke up this am with green lines and dots on the palm of his hand-increased pain that started last night

## 2020-03-09 NOTE — Discharge Instructions (Addendum)
Your symptoms today are consistent with carpal tunnel syndrome.   Unfortunately you have already taken a course of steroids and therefore cannot do a repeat course so close together as this may be detrimental to her health.   Please continue to wear the wrist splint provided.  You may also purchase a compression stocking for your hand.  You may take over-the-counter Tylenol or ibuprofen.   Please make sure to keep your appointment with hand surgery Dr. Amedeo Plenty , and also please call the neurology office provided as well as you will likely need treatment for both offices.  As they will be able to provide the most helpful for your symptoms.  Return to the ER if you have worsening redness, swelling, fevers, chills, temperature change to your hand, or any other worsening symptoms.

## 2020-03-09 NOTE — ED Provider Notes (Addendum)
[facility] DEPT Provider Note   CSN: [id number] Arrival date & time: 03/09/20  1045     History Chief Complaint  Patient presents with  . Hand Pain    Juan Rogers is a 44 y.o. male.  HPI 44 year old male with a history of asthma presents to the ER for right wrist pain.  Patient was seen here 02/21/2020 and was diagnosed carpal tunnel.  Patient states that approximately 3 weeks ago he was lifting a heavy tire and moved to his right hand in the wrong way and started feeling tightness and tingling in his hand.  He had presented to the ER, diagnosed with carpal tunnel, given steroids, told to splint  wrist.  He states that the steroids have mildly helped, but he called the hand surgery group that he was referred to and he is not able to get into August.  He is continuing to have numbness and tingling in his fingers, worse at night.  He noticed some "green lines in his hand" this morning.  He has had no new or worsening symptoms other than the "green lines".  No fevers or chills, no swelling or redness.  He is reports significant pain with bending his wrist.  He is frustrated as his pain has been significant and it has been waking him up at night.    Past Medical History:  Diagnosis Date  . Acne   . Asthma     Patient Active Problem List   Diagnosis Date Noted  . Viral gastroenteritis 04/03/2018  . Acute bilateral low back pain 12/12/2017  . Acne vulgaris 07/19/2017  . Biceps tendinopathy, right 01/19/2017  . Nevus 10/19/2012  . 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 FOOT SURGERY Right        Family History  Problem Relation Age of Onset  . Asthma Mother     Social History   Tobacco Use  . Smoking status: Never Smoker  . Smokeless tobacco: Never Used  Vaping Use  . Vaping Use: Never used  Substance Use Topics  . Alcohol use: No  . Drug use: No    Comment: Denies recreational drug use.      Home Medications Prior to Admission medications   Medication Sig Start Date End Date Taking? Authorizing Provider  cyclobenzaprine (FLEXERIL) 10 MG tablet Take 1 tablet (10 mg total) by mouth 2 (two) times daily as needed for muscle spasms. 11/02/18   Domenic Moras, PA-C  diclofenac Sodium (VOLTAREN) 1 % GEL Apply 2 g topically 4 (four) times daily. 02/21/20   Caccavale, Sophia, PA-C  ibuprofen (ADVIL,MOTRIN) 600 MG tablet Take 1 tablet (600 mg total) by mouth every 6 (six) hours as needed. 11/02/18   Domenic Moras, PA-C  tetracycline (SUMYCIN) 250 MG capsule TAKE 1 CAPSULE(250 MG) BY MOUTH TWICE DAILY BEFORE A MEAL 10/27/19   Jaynee Eagles, PA-C    Allergies    Patient has no known allergies.  Review of Systems   Review of Systems  Constitutional: Negative for chills and fever.  Musculoskeletal: Positive for arthralgias (RIGHT WRIST PAIN).  Skin: Negative for color change, rash and wound.  Neurological: Positive for weakness and numbness.    Physical Exam Updated Vital Signs BP (!) 133/95   Pulse 68   Temp 98.4 F (36.9 C) (Oral)   Resp 16   SpO2 100%   Physical Exam Vitals and nursing note reviewed.  Constitutional:  Appearance: He is well-developed.  HENT:     Head: Normocephalic and atraumatic.  Eyes:     Conjunctiva/sclera: Conjunctivae normal.  Cardiovascular:     Rate and Rhythm: Normal rate and regular rhythm.     Heart sounds: No murmur heard.   Pulmonary:     Effort: Pulmonary effort is normal. No respiratory distress.     Breath sounds: Normal breath sounds.  Abdominal:     Palpations: Abdomen is soft.     Tenderness: There is no abdominal tenderness.  Musculoskeletal:     Right wrist: No snuff box tenderness.     Left wrist: No snuff box tenderness.     Right hand: Tenderness present. No swelling, deformity, lacerations or bony tenderness. Decreased range of motion. Decreased strength (Grip strength). Normal sensation. Normal capillary refill.     Left  hand: No swelling, deformity or lacerations. Normal strength. Normal sensation. Normal capillary refill.       Arms:     Cervical back: Neck supple.     Comments: 2+ Radial pulses, >2 cap refill; positive Tinel, positive Phalen's.  No snuffbox tenderness.  Visible veins in hands, without evidence of vasculitis, erythema, swelling.  Limited range of motion secondary to pain.  Gross sensations intact.  Skin:    General: Skin is warm and dry.  Neurological:     Mental Status: He is alert.     ED Results / Procedures / Treatments   Labs (all labs ordered are listed, but only abnormal results are displayed) Labs Reviewed - No data to display  EKG None  Radiology DG Hand Complete Right  Result Date: 03/09/2020 CLINICAL DATA:  Injured right wrist 3 weeks ago now with green lines and dots on the palm of his hand. EXAM: RIGHT HAND - COMPLETE 3+ VIEW COMPARISON:  None. FINDINGS: No fracture or dislocation. Joint spaces are preserved. No erosions. No evidence of chondrocalcinosis. Regional soft tissues appear normal. No subcutaneous emphysema. No discrete areas of osteolysis to suggest osteomyelitis. No radiopaque foreign body. IMPRESSION: Normal radiographs of the right hand. Electronically Signed   By: Sandi Mariscal M.D.   On: 03/09/2020 11:32    Procedures Procedures (including critical care time)  Medications Ordered in ED Medications - No data to display  ED Course  I have reviewed the triage vital signs and the nursing notes.  Pertinent labs & imaging results that were available during my care of the patient were reviewed by me and considered in my medical decision making (see chart for details).    MDM Rules/Calculators/A&P                         44 year old male with right wrist pain, seen in the ED on 6/10 and diagnosed with carpal tunnel On presentation, the patient has a positive Tinel and Phalen sign, I agree with the previous diagnosis of carpal tunnel. Plain films of right  wrist without abnormalities.   He has had no new change in symptoms under the "green lines" in his hands, which are consistent with veins.  There is no erythema, tenderness to the veins, no evidence of vasculitis.  There is no erythema, swelling, cellulitis, doubt septic joint.  He has a thumb wrist splint, which I do not think is providing adequate enough support.  We will order him a wrist splint here.  He states that he had been put on some steroids a few weeks ago which helped mildly.  He is  not a candidate for repeat steroid therapy given he was just given a course a few weeks ago.  He has a pending hand follow-up in August, and is on the list for any early cancellations to get in sooner.  Unfortunately there is not much else that I can offer him here in the ER today.  I encouraged him to keep his hand appointment, encouraged gentle stretching. I do not suspect thrombophlebitis as a cause of his "green lines"; however, even if this is thrombophlebitis I encouraged the patient to keep the splint on and take NSAIDS which would treat this condition as well.  Carpal tunnel Information provided.  I again reiterated scheduling appointment with neurology and keeping his appointment with Dr. Amedeo Plenty.  He  is agreeable and voices understanding.  Return precautions given which included worsening swelling, erythema, fevers, chills, temperature or color change to the hand.  At this stage in the ED course, the patient is medically screened and stable for discharge.  Final Clinical Impression(s) / ED Diagnoses Final diagnoses:  Carpal tunnel syndrome of right wrist    Rx / DC Orders ED Discharge Orders    None         Lyndel Safe 03/09/20 1311    Daleen Bo, MD 03/09/20 1736

## 2020-04-15 ENCOUNTER — Ambulatory Visit: Payer: Self-pay | Admitting: Diagnostic Neuroimaging

## 2020-04-23 ENCOUNTER — Ambulatory Visit: Payer: Self-pay | Admitting: Diagnostic Neuroimaging

## 2020-06-03 ENCOUNTER — Other Ambulatory Visit: Payer: Self-pay

## 2020-06-03 ENCOUNTER — Encounter (HOSPITAL_COMMUNITY): Payer: Self-pay

## 2020-06-03 ENCOUNTER — Ambulatory Visit (HOSPITAL_COMMUNITY)
Admission: RE | Admit: 2020-06-03 | Discharge: 2020-06-03 | Disposition: A | Discharge status: Home / Self Care | Payer: BC Managed Care – PPO | Source: Ambulatory Visit | Attending: Internal Medicine | Admitting: Internal Medicine

## 2020-06-03 DIAGNOSIS — Z20822 Contact with and (suspected) exposure to covid-19: Secondary | ICD-10-CM | POA: Insufficient documentation

## 2020-06-03 LAB — SARS CORONAVIRUS 2 (TAT 6-24 HRS): SARS Coronavirus 2: NEGATIVE

## 2020-06-03 NOTE — ED Triage Notes (Signed)
Pt is here with a COVID exposure, pt is denying ALL sxs today.

## 2020-06-19 ENCOUNTER — Other Ambulatory Visit: Payer: Self-pay

## 2020-06-19 ENCOUNTER — Ambulatory Visit (HOSPITAL_COMMUNITY)
Admission: RE | Admit: 2020-06-19 | Discharge: 2020-06-19 | Disposition: A | Discharge status: Home / Self Care | Payer: BC Managed Care – PPO | Source: Ambulatory Visit | Attending: Internal Medicine | Admitting: Internal Medicine

## 2020-06-19 DIAGNOSIS — Z20822 Contact with and (suspected) exposure to covid-19: Secondary | ICD-10-CM | POA: Diagnosis not present

## 2020-06-19 DIAGNOSIS — Z1152 Encounter for screening for COVID-19: Secondary | ICD-10-CM

## 2020-06-19 LAB — SARS CORONAVIRUS 2 (TAT 6-24 HRS): SARS Coronavirus 2: NEGATIVE

## 2020-06-19 NOTE — Discharge Instructions (Signed)
If your Covid-19 test is positive, you will get a phone call from [facility] regarding your results. If your Covid-19 test is negative, you will NOT get a phone call from [facility] with your results. You may view your results on MyChart. If you do not have a MyChart account, sign up instructions are in your discharge papers. ° °

## 2020-06-19 NOTE — ED Triage Notes (Signed)
Pt presents for covid testing no return to work with no known symptoms.

## 2020-06-23 ENCOUNTER — Other Ambulatory Visit: Payer: Self-pay

## 2020-06-23 ENCOUNTER — Encounter (HOSPITAL_COMMUNITY): Payer: Self-pay | Admitting: Emergency Medicine

## 2020-06-23 ENCOUNTER — Ambulatory Visit (HOSPITAL_COMMUNITY)
Admission: EM | Admit: 2020-06-23 | Discharge: 2020-06-23 | Disposition: A | Discharge status: Home / Self Care | Payer: BC Managed Care – PPO | Attending: Family Medicine | Admitting: Family Medicine

## 2020-06-23 DIAGNOSIS — Z20822 Contact with and (suspected) exposure to covid-19: Secondary | ICD-10-CM | POA: Insufficient documentation

## 2020-06-23 DIAGNOSIS — B349 Viral infection, unspecified: Secondary | ICD-10-CM | POA: Diagnosis present

## 2020-06-23 MED ORDER — ONDANSETRON 4 MG PO TBDP: 4.0000 mg | ORAL_TABLET | Freq: Three times a day (TID) | ORAL | 0 refills | Status: DC | PRN

## 2020-06-23 NOTE — ED Triage Notes (Signed)
Pt c/o covid exposure last week in his son. Pt states he tested negative last week for covid. Pt states he has had headache, dizziness and nausea onset Friday. Pt  States he is not having diarrhea but he is having frequent stools.

## 2020-06-23 NOTE — Discharge Instructions (Signed)
Retesting you for covid Zofran as needed for nausea and vomiting OTC medicines as needed  Hydrate.  Rest,  Follow up as needed for continued or worsening symptoms

## 2020-06-24 LAB — SARS CORONAVIRUS 2 (TAT 6-24 HRS): SARS Coronavirus 2: NEGATIVE

## 2020-06-24 NOTE — ED Provider Notes (Signed)
[facility]    CSN: [id number] Arrival date & time: 06/23/20  4627      History   Chief Complaint Chief Complaint  Patient presents with  . Covid Exposure    HPI Juan Rogers is a 44 y.o. male.   Patient is a 44 year old male who presents today for Covid exposure. Was exposed to his son last week with Covid. Tested negative last week before symptoms started. Since Friday he has had headache, dizziness, nausea not having diarrhea but has had frequent stools. Just overall weak, body chills. Taking over-the-counter medicines as needed. No cough, chest pain, SOB.      Past Medical History:  Diagnosis Date  . Acne   . Asthma     Patient Active Problem List   Diagnosis Date Noted  . Viral gastroenteritis 04/03/2018  . Acute bilateral low back pain 12/12/2017  . Acne vulgaris 07/19/2017  . Biceps tendinopathy, right 01/19/2017  . Nevus 10/19/2012  . 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 FOOT SURGERY Right        Home Medications    Prior to Admission medications   Medication Sig Start Date End Date Taking? Authorizing Provider  cyclobenzaprine (FLEXERIL) 10 MG tablet Take 1 tablet (10 mg total) by mouth 2 (two) times daily as needed for muscle spasms. 11/02/18   Domenic Moras, PA-C  diclofenac Sodium (VOLTAREN) 1 % GEL Apply 2 g topically 4 (four) times daily. 02/21/20   Caccavale, Sophia, PA-C  ibuprofen (ADVIL,MOTRIN) 600 MG tablet Take 1 tablet (600 mg total) by mouth every 6 (six) hours as needed. 11/02/18   Domenic Moras, PA-C  ondansetron (ZOFRAN ODT) 4 MG disintegrating tablet Take 1 tablet (4 mg total) by mouth every 8 (eight) hours as needed for nausea or vomiting. 06/23/20   Orvan July, NP  tetracycline (SUMYCIN) 250 MG capsule TAKE 1 CAPSULE(250 MG) BY MOUTH TWICE DAILY BEFORE A MEAL 10/27/19   Jaynee Eagles, PA-C    Family History Family History  Problem Relation Age of Onset  . Asthma Mother      Social History Social History   Tobacco Use  . Smoking status: Never Smoker  . Smokeless tobacco: Never Used  Vaping Use  . Vaping Use: Never used  Substance Use Topics  . Alcohol use: No  . Drug use: No    Comment: Denies recreational drug use.     Allergies   Patient has no known allergies.   Review of Systems Review of Systems   Physical Exam Triage Vital Signs ED Triage Vitals  Enc Vitals Group     BP 06/23/20 1032 136/89     Pulse Rate 06/23/20 1032 77     Resp 06/23/20 1032 18     Temp 06/23/20 1032 (!) 97.4 F (36.3 C)     Temp Source 06/23/20 1032 Oral     SpO2 06/23/20 1032 100 %     Weight --      Height --      Head Circumference --      Peak Flow --      Pain Score 06/23/20 1029 4     Pain Loc --      Pain Edu? --      Excl. in [place]? --    No data found.  Updated Vital Signs BP 136/89 (BP Location: Left Arm)   Pulse 77   Temp (!) 97.4 F (36.3 C) (Oral)  Resp 18   SpO2 100%   Visual Acuity Right Eye Distance:   Left Eye Distance:   Bilateral Distance:    Right Eye Near:   Left Eye Near:    Bilateral Near:     Physical Exam Vitals and nursing note reviewed.  Constitutional:      General: He is not in acute distress.    Appearance: Normal appearance. He is not ill-appearing, toxic-appearing or diaphoretic.  HENT:     Head: Normocephalic and atraumatic.     Nose: Nose normal.  Eyes:     Conjunctiva/sclera: Conjunctivae normal.  Pulmonary:     Effort: Pulmonary effort is normal.  Musculoskeletal:        General: Normal range of motion.     Cervical back: Normal range of motion.  Skin:    General: Skin is warm and dry.  Neurological:     Mental Status: He is alert.  Psychiatric:        Mood and Affect: Mood normal.      UC Treatments / Results  Labs (all labs ordered are listed, but only abnormal results are displayed) Labs Reviewed  SARS CORONAVIRUS 2 (TAT 6-24 HRS)    EKG   Radiology No results  found.  Procedures Procedures (including critical care time)  Medications Ordered in UC Medications - No data to display  Initial Impression / Assessment and Plan / UC Course  I have reviewed the triage vital signs and the nursing notes.  Pertinent labs & imaging results that were available during my care of the patient were reviewed by me and considered in my medical decision making (see chart for details).     Viral illness Covid swab pending. Retesting for Covid Over-the-counter as needed. Zofran for nausea vomiting as needed. Rest, hydrate Work not given Follow up as needed for continued or worsening symptoms  Final Clinical Impressions(s) / UC Diagnoses   Final diagnoses:  Viral illness     Discharge Instructions     Retesting you for covid Zofran as needed for nausea and vomiting OTC medicines as needed  Hydrate.  Rest,  Follow up as needed for continued or worsening symptoms        ED Prescriptions    Medication Sig Dispense Auth. Provider   ondansetron (ZOFRAN ODT) 4 MG disintegrating tablet Take 1 tablet (4 mg total) by mouth every 8 (eight) hours as needed for nausea or vomiting. 20 tablet Loura Halt A, NP     PDMP not reviewed this encounter.   Orvan July, NP 06/24/20 1123

## 2020-09-01 ENCOUNTER — Ambulatory Visit (HOSPITAL_COMMUNITY): Payer: Self-pay

## 2020-09-02 ENCOUNTER — Other Ambulatory Visit: Payer: Self-pay

## 2020-09-02 DIAGNOSIS — J45909 Unspecified asthma, uncomplicated: Secondary | ICD-10-CM | POA: Insufficient documentation

## 2020-09-02 DIAGNOSIS — M25531 Pain in right wrist: Secondary | ICD-10-CM | POA: Diagnosis not present

## 2020-09-02 NOTE — ED Triage Notes (Signed)
Pt reports that he had an accident at work in 12/10. He got a cortisone shot yesterday at his PCP in his R wrist yesterday and states that today his hand feels hot and the PCP told him to come in.

## 2020-09-03 ENCOUNTER — Emergency Department (HOSPITAL_COMMUNITY)
Admission: EM | Admit: 2020-09-03 | Discharge: 2020-09-03 | Disposition: A | Discharge status: Home / Self Care | Payer: No Typology Code available for payment source | Attending: Emergency Medicine | Admitting: Emergency Medicine

## 2020-09-03 DIAGNOSIS — M25531 Pain in right wrist: Secondary | ICD-10-CM

## 2020-09-03 NOTE — ED Provider Notes (Signed)
[facility]-EMERGENCY DEPT Provider Note   CSN: [id number] Arrival date & time: 09/02/20  2103     History Chief Complaint  Patient presents with  . Wrist Pain    Juan Rogers is a 44 y.o. male.  Patient hurt his wrist a couple weeks ago.  Is been seen by urgent care, PCP, orthopedics and has had an MRI and wrist injections.  Apparently after his wrist injection yesterday he felt some tingling in his fingers and some warmth that was new.  He did not notice any drainage, redness around the wound or warmth on his skin.  No fevers, nausea, vomiting or malaise.  No other symptoms.  However his doctors told him that he needed to come here to be evaluated at that point.        Past Medical History:  Diagnosis Date  . Acne   . Asthma     Patient Active Problem List   Diagnosis Date Noted  . Viral gastroenteritis 04/03/2018  . Acute bilateral low back pain 12/12/2017  . Acne vulgaris 07/19/2017  . Biceps tendinopathy, right 01/19/2017  . Nevus 10/19/2012  . 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 FOOT SURGERY Right        Family History  Problem Relation Age of Onset  . Asthma Mother     Social History   Tobacco Use  . Smoking status: Never Smoker  . Smokeless tobacco: Never Used  Vaping Use  . Vaping Use: Never used  Substance Use Topics  . Alcohol use: No  . Drug use: No    Comment: Denies recreational drug use.    Home Medications Prior to Admission medications   Medication Sig Start Date End Date Taking? Authorizing Provider  cyclobenzaprine (FLEXERIL) 10 MG tablet Take 1 tablet (10 mg total) by mouth 2 (two) times daily as needed for muscle spasms. 11/02/18   Fayrene Helper, PA-C  diclofenac Sodium (VOLTAREN) 1 % GEL Apply 2 g topically 4 (four) times daily. 02/21/20   Caccavale, Sophia, PA-C  ibuprofen (ADVIL,MOTRIN) 600 MG tablet Take 1 tablet (600 mg total) by mouth every 6 (six) hours  as needed. 11/02/18   Fayrene Helper, PA-C  ondansetron (ZOFRAN ODT) 4 MG disintegrating tablet Take 1 tablet (4 mg total) by mouth every 8 (eight) hours as needed for nausea or vomiting. 06/23/20   Janace Aris, NP  tetracycline (SUMYCIN) 250 MG capsule TAKE 1 CAPSULE(250 MG) BY MOUTH TWICE DAILY BEFORE A MEAL 10/27/19   Wallis Bamberg, PA-C    Allergies    Patient has no known allergies.  Review of Systems   Review of Systems  All other systems reviewed and are negative.   Physical Exam Updated Vital Signs BP (!) 150/123 (BP Location: Left Arm)   Pulse 80   Temp 97.8 F (36.6 C) (Oral)   Resp 17   Ht 6\' 1"  (1.854 m)   Wt 89.4 kg   SpO2 100%   BMI 25.99 kg/m   Physical Exam Vitals and nursing note reviewed.  Constitutional:      Appearance: He is well-developed and well-nourished.  HENT:     Head: Normocephalic and atraumatic.  Cardiovascular:     Rate and Rhythm: Normal rate.  Pulmonary:     Effort: Pulmonary effort is normal. No respiratory distress.  Abdominal:     General: There is no distension.  Musculoskeletal:        General: Swelling  and tenderness (to right wrist around the area of the carpal tunnel) present. No deformity or signs of injury.     Cervical back: Normal range of motion.  Neurological:     Mental Status: He is alert.     ED Results / Procedures / Treatments   Labs (all labs ordered are listed, but only abnormal results are displayed) Labs Reviewed - No data to display  EKG None  Radiology No results found.  Procedures Procedures (including critical care time)  Medications Ordered in ED Medications - No data to display  ED Course  I have reviewed the triage vital signs and the nursing notes.  Pertinent labs & imaging results that were available during my care of the patient were reviewed by me and considered in my medical decision making (see chart for details).    MDM Rules/Calculators/A&P                          No obvious  infection. Already had MRI recently without results yet. Will continue following up with outpatient physicians.   Final Clinical Impression(s) / ED Diagnoses Final diagnoses:  Right wrist pain    Rx / DC Orders ED Discharge Orders    None       Raciel Caffrey, Corene Cornea, MD 09/03/20 [phone]

## 2020-09-04 ENCOUNTER — Encounter (HOSPITAL_COMMUNITY): Payer: Self-pay | Admitting: Emergency Medicine

## 2020-09-04 ENCOUNTER — Other Ambulatory Visit: Payer: Self-pay

## 2020-09-04 ENCOUNTER — Ambulatory Visit (HOSPITAL_COMMUNITY)
Admission: EM | Admit: 2020-09-04 | Discharge: 2020-09-04 | Disposition: A | Discharge status: Home / Self Care | Payer: BC Managed Care – PPO | Attending: Family Medicine | Admitting: Family Medicine

## 2020-09-04 DIAGNOSIS — Z20822 Contact with and (suspected) exposure to covid-19: Secondary | ICD-10-CM | POA: Insufficient documentation

## 2020-09-04 NOTE — ED Provider Notes (Signed)
[place]    CSN: [id number] Arrival date & time: 09/04/20  1821      History   Chief Complaint No chief complaint on file.   HPI Juan Rogers is a 44 y.o. male.   He is presenting with headache chills emesis and diarrhea.  Reports he was in emergency department a few days ago.  Has not been vaccinated against Covid.  No measured fever.  HPI  Past Medical History:  Diagnosis Date  . Acne   . Asthma     Patient Active Problem List   Diagnosis Date Noted  . Viral gastroenteritis 04/03/2018  . Acute bilateral low back pain 12/12/2017  . Acne vulgaris 07/19/2017  . Biceps tendinopathy, right 01/19/2017  . Nevus 10/19/2012  . 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 FOOT SURGERY Right        Home Medications    Prior to Admission medications   Medication Sig Start Date End Date Taking? Authorizing Provider  cyclobenzaprine (FLEXERIL) 10 MG tablet Take 1 tablet (10 mg total) by mouth 2 (two) times daily as needed for muscle spasms. 11/02/18   Juan Moras, PA-C  diclofenac Sodium (VOLTAREN) 1 % GEL Apply 2 g topically 4 (four) times daily. 02/21/20   Caccavale, Sophia, PA-C  ibuprofen (ADVIL,MOTRIN) 600 MG tablet Take 1 tablet (600 mg total) by mouth every 6 (six) hours as needed. 11/02/18   Juan Moras, PA-C  ondansetron (ZOFRAN ODT) 4 MG disintegrating tablet Take 1 tablet (4 mg total) by mouth every 8 (eight) hours as needed for nausea or vomiting. 06/23/20   Juan July, NP  tetracycline (SUMYCIN) 250 MG capsule TAKE 1 CAPSULE(250 MG) BY MOUTH TWICE DAILY BEFORE A MEAL 10/27/19   Juan Eagles, PA-C    Family History Family History  Problem Relation Age of Onset  . Asthma Mother     Social History Social History   Tobacco Use  . Smoking status: Never Smoker  . Smokeless tobacco: Never Used  Vaping Use  . Vaping Use: Never used  Substance Use Topics  . Alcohol use: No  . Drug use: No     Comment: Denies recreational drug use.     Allergies   Patient has no known allergies.   Review of Systems Review of Systems  See HPI   Physical Exam Triage Vital Signs ED Triage Vitals [09/04/20 1902]  Enc Vitals Group     BP      Pulse      Resp      Temp      Temp src      SpO2      Weight      Height      Head Circumference      Peak Flow      Pain Score 8     Pain Loc      Pain Edu?      Excl. in [place]?    No data found.  Updated Vital Signs BP (!) 144/90 (BP Location: Right Arm)   Pulse 89   Temp (!) 95.1 F (35.1 C) (Oral)   Resp 18   SpO2 98%   Visual Acuity Right Eye Distance:   Left Eye Distance:   Bilateral Distance:    Right Eye Near:   Left Eye Near:    Bilateral Near:     Physical Exam Gen: NAD, alert, cooperative with exam,  ENT: normal lips, normal  nasal mucosa,  Eye: normal EOM, normal conjunctiva and lids CV:  , S1-S2   Resp: no accessory muscle use, non-labored, clear to auscultation bilaterally, no crackles or wheezes Skin: no rashes, no areas of induration  Neuro: normal tone, normal sensation to touch   UC Treatments / Results  Labs (all labs ordered are listed, but only abnormal results are displayed) Labs Reviewed  SARS CORONAVIRUS 2 (TAT 6-24 HRS)    EKG   Radiology No results found.  Procedures Procedures (including critical care time)  Medications Ordered in UC Medications - No data to display  Initial Impression / Assessment and Plan / UC Course  I have reviewed the triage vital signs and the nursing notes.  Pertinent labs & imaging results that were available during my care of the patient were reviewed by me and considered in my medical decision making (see chart for details).     Juan Rogers is a 44 yo M that is presenting with concern for virus and possible COVID in origin. Having GI symptoms as well. Clinical exam was reassuring. COVID swab was obtained. Counseled on supportive care. Given indications  to seek immediate care and for follow up.   Final Clinical Impressions(s) / UC Diagnoses   Final diagnoses:  Suspected COVID-19 virus infection     Discharge Instructions     We will call with positive result.  Please stay hydrated  Please use ibuprofen and tylenol as needed  Please follow up if your symptoms fail to improve.     ED Prescriptions    None     PDMP not reviewed this encounter.   Rosemarie Ax, MD 09/04/20 2100

## 2020-09-04 NOTE — Discharge Instructions (Signed)
We will call with positive result.  Please stay hydrated  Please use ibuprofen and tylenol as needed  Please follow up if your symptoms fail to improve.

## 2020-09-04 NOTE — ED Triage Notes (Signed)
Pt c/o of abdominal pain with n/v/d x 1 day. +headache, chills

## 2020-09-05 LAB — SARS CORONAVIRUS 2 (TAT 6-24 HRS): SARS Coronavirus 2: NEGATIVE

## 2020-09-13 ENCOUNTER — Ambulatory Visit (HOSPITAL_COMMUNITY)
Admission: EM | Admit: 2020-09-13 | Discharge: 2020-09-13 | Disposition: A | Discharge status: Home / Self Care | Payer: BC Managed Care – PPO | Attending: Student | Admitting: Student

## 2020-09-13 ENCOUNTER — Encounter (HOSPITAL_COMMUNITY): Payer: Self-pay

## 2020-09-13 ENCOUNTER — Other Ambulatory Visit: Payer: Self-pay

## 2020-09-13 ENCOUNTER — Ambulatory Visit (HOSPITAL_COMMUNITY): Admit: 2020-09-13 | Discharge status: Absent without leave | Payer: BC Managed Care – PPO

## 2020-09-13 DIAGNOSIS — R519 Headache, unspecified: Secondary | ICD-10-CM | POA: Diagnosis not present

## 2020-09-13 DIAGNOSIS — Z20822 Contact with and (suspected) exposure to covid-19: Secondary | ICD-10-CM | POA: Insufficient documentation

## 2020-09-13 LAB — RESP PANEL BY RT-PCR (FLU A&B, COVID) ARPGX2
Influenza A by PCR: NEGATIVE
Influenza B by PCR: NEGATIVE
SARS Coronavirus 2 by RT PCR: NEGATIVE

## 2020-09-13 NOTE — ED Provider Notes (Signed)
[place]    CSN: [id number] Arrival date & time: 09/13/20  1253      History   Chief Complaint Chief Complaint  Patient presents with  . Headache  . Dizziness    HPI SAMRUDH BORGO is a 45 y.o. male Presenting for decreased appetite and headaches for 2 days. History of asthma. Presenting with headaches and dizziness. States he feels lightheaded when he stands up, but otherwise denies dizziness at rest. Denies syncope. endorses decreased appetite but adamantly denies n/v/d/abd pian.   Denies fevers/chills, n/v/d, shortness of breath, chest pain, cough, congestion, facial pain, teeth pain, sore throat, loss of taste/smell, swollen lymph nodes, ear pain.  Denies chest pain, shortness of breath, confusion, high fevers.  Not vaccinated for covid-19 and was exposed several days ago at work.  Denies worst headache of life, thunderclap headache, weakness/sensation changes in arms/legs, vision changes, shortness of breath, chest pain/pressure, photophobia, phonophobia, n/v/d.     HPI  Past Medical History:  Diagnosis Date  . Acne   . Asthma     Patient Active Problem List   Diagnosis Date Noted  . Viral gastroenteritis 04/03/2018  . Acute bilateral low back pain 12/12/2017  . Acne vulgaris 07/19/2017  . Biceps tendinopathy, right 01/19/2017  . Nevus 10/19/2012  . 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 FOOT SURGERY Right        Home Medications    Prior to Admission medications   Medication Sig Start Date End Date Taking? Authorizing Provider  cyclobenzaprine (FLEXERIL) 10 MG tablet Take 1 tablet (10 mg total) by mouth 2 (two) times daily as needed for muscle spasms. 11/02/18   Domenic Moras, PA-C  diclofenac Sodium (VOLTAREN) 1 % GEL Apply 2 g topically 4 (four) times daily. 02/21/20   Caccavale, Sophia, PA-C  HYDROcodone-acetaminophen (NORCO/VICODIN) 5-325 MG tablet SMARTSIG:1-2 Tablet(s) By Mouth 2-3 Times  Daily PRN 09/01/20   [provider]  ibuprofen (ADVIL,MOTRIN) 600 MG tablet Take 1 tablet (600 mg total) by mouth every 6 (six) hours as needed. 11/02/18   Domenic Moras, PA-C  ondansetron (ZOFRAN ODT) 4 MG disintegrating tablet Take 1 tablet (4 mg total) by mouth every 8 (eight) hours as needed for nausea or vomiting. 06/23/20   Orvan July, NP  tetracycline (SUMYCIN) 250 MG capsule TAKE 1 CAPSULE(250 MG) BY MOUTH TWICE DAILY BEFORE A MEAL 10/27/19   Jaynee Eagles, PA-C    Family History Family History  Problem Relation Age of Onset  . Asthma Mother     Social History Social History   Tobacco Use  . Smoking status: Never Smoker  . Smokeless tobacco: Never Used  Vaping Use  . Vaping Use: Never used  Substance Use Topics  . Alcohol use: No  . Drug use: No    Comment: Denies recreational drug use.     Allergies   Patient has no known allergies.   Review of Systems Review of Systems  Constitutional: Positive for appetite change. Negative for chills and fever.  HENT: Negative for congestion, ear pain, rhinorrhea, sinus pressure, sinus pain and sore throat.   Eyes: Negative for redness and visual disturbance.  Respiratory: Negative for cough, chest tightness, shortness of breath and wheezing.   Cardiovascular: Negative for chest pain and palpitations.  Gastrointestinal: Negative for abdominal pain, constipation, diarrhea, nausea and vomiting.  Genitourinary: Negative for dysuria, frequency and urgency.  Musculoskeletal: Negative for myalgias.  Neurological: Positive for headaches. Negative for  dizziness and weakness.  Psychiatric/Behavioral: Negative for confusion.  All other systems reviewed and are negative.    Physical Exam Triage Vital Signs ED Triage Vitals  Enc Vitals Group     BP 09/13/20 1454 125/78     Pulse Rate 09/13/20 1454 66     Resp 09/13/20 1454 20     Temp 09/13/20 1454 97.8 F (36.6 C)     Temp Source 09/13/20 1454 Oral     SpO2 09/13/20  1454 98 %     Weight --      Height --      Head Circumference --      Peak Flow --      Pain Score 09/13/20 1456 8     Pain Loc --      Pain Edu? --      Excl. in [place]? --    No data found.  Updated Vital Signs BP 125/78 (BP Location: Right Arm)   Pulse 66   Temp 97.8 F (36.6 C) (Oral)   Resp 20   SpO2 98%   Visual Acuity Right Eye Distance:   Left Eye Distance:   Bilateral Distance:    Right Eye Near:   Left Eye Near:    Bilateral Near:     Physical Exam Vitals reviewed.  Constitutional:      General: He is not in acute distress.    Appearance: Normal appearance. He is ill-appearing.  HENT:     Head: Normocephalic and atraumatic.     Right Ear: Hearing, tympanic membrane, ear canal and external ear normal. No swelling or tenderness. There is no impacted cerumen. No mastoid tenderness. Tympanic membrane is not perforated, erythematous, retracted or bulging.     Left Ear: Hearing, tympanic membrane, ear canal and external ear normal. No swelling or tenderness. There is no impacted cerumen. No mastoid tenderness. Tympanic membrane is not perforated, erythematous, retracted or bulging.     Nose:     Right Sinus: No maxillary sinus tenderness or frontal sinus tenderness.     Left Sinus: No maxillary sinus tenderness or frontal sinus tenderness.     Mouth/Throat:     Mouth: Mucous membranes are moist.     Pharynx: Uvula midline. No oropharyngeal exudate or posterior oropharyngeal erythema.     Tonsils: No tonsillar exudate.  Eyes:     Pupils: Pupils are equal, round, and reactive to light.  Cardiovascular:     Rate and Rhythm: Normal rate and regular rhythm.     Heart sounds: Normal heart sounds.  Pulmonary:     Breath sounds: Normal breath sounds and air entry. No wheezing, rhonchi or rales.  Chest:     Chest wall: No tenderness.  Abdominal:     General: Abdomen is flat. Bowel sounds are normal.     Tenderness: There is no abdominal tenderness. There is no right  CVA tenderness, left CVA tenderness, guarding or rebound. Negative signs include Murphy's sign, Rovsing's sign and McBurney's sign.  Lymphadenopathy:     Cervical: No cervical adenopathy.  Neurological:     General: No focal deficit present.     Mental Status: He is alert and oriented to person, place, and time.     Comments: PERRLA, EOMI, CN 2-12 grossly intact  Psychiatric:        Attention and Perception: Attention and perception normal.        Mood and Affect: Mood and affect normal.        Behavior: Behavior  normal. Behavior is cooperative.        Thought Content: Thought content normal.        Judgment: Judgment normal.      UC Treatments / Results  Labs (all labs ordered are listed, but only abnormal results are displayed) Labs Reviewed  RESP PANEL BY RT-PCR (FLU A&B, COVID) [id number]    EKG   Radiology No results found.  Procedures Procedures (including critical care time)  Medications Ordered in UC Medications - No data to display  Initial Impression / Assessment and Plan / UC Course  I have reviewed the triage vital signs and the nursing notes.  Pertinent labs & imaging results that were available during my care of the patient were reviewed by me and considered in my medical decision making (see chart for details).     Covid and influenza tests sent today. Patient is not vaccinated for covid-19 and was just exposed at work last week. Isolation precautions per CDC guidelines until negative result. Symptomatic relief with OTC Mucinex, Nyquil, etc. Return precautions- new/worsening fevers/chills, shortness of breath, chest pain, abd pain, etc.   Final Clinical Impressions(s) / UC Diagnoses   Final diagnoses:  Bad headache  Suspected COVID-19 virus infection     Discharge Instructions     -Tylenol and ibuprofen for headaches -Drink plenty of fluids  Seek additional medical attention if you develop the worst headache of your life, chest pain, shortness of  breath, new/worsening fevers/chills, confusion, worsening of symptoms despite the above treatment plan, etc.      ED Prescriptions    None     PDMP not reviewed this encounter.   Rhys Martini, PA-C 09/13/20 1609

## 2020-09-13 NOTE — Discharge Instructions (Addendum)
-  Tylenol and ibuprofen for headaches -Drink plenty of fluids  Seek additional medical attention if you develop the worst headache of your life, chest pain, shortness of breath, new/worsening fevers/chills, confusion, worsening of symptoms despite the above treatment plan, etc.

## 2020-09-13 NOTE — ED Triage Notes (Signed)
Pt presents with headache and low appetite x 2 days,. Report feeling lightheaded when standing up after being lay down in bed. Ibuprofen gives no relief. Denies fever, sob, cough.

## 2021-04-03 ENCOUNTER — Encounter (HOSPITAL_COMMUNITY): Payer: Self-pay

## 2021-04-03 ENCOUNTER — Emergency Department (HOSPITAL_COMMUNITY): Payer: BC Managed Care – PPO

## 2021-04-03 ENCOUNTER — Emergency Department (HOSPITAL_COMMUNITY)
Admission: EM | Admit: 2021-04-03 | Discharge: 2021-04-03 | Disposition: A | Discharge status: Home / Self Care | Payer: BC Managed Care – PPO | Attending: Emergency Medicine | Admitting: Emergency Medicine

## 2021-04-03 ENCOUNTER — Other Ambulatory Visit: Payer: Self-pay

## 2021-04-03 DIAGNOSIS — Z20822 Contact with and (suspected) exposure to covid-19: Secondary | ICD-10-CM | POA: Insufficient documentation

## 2021-04-03 DIAGNOSIS — R0789 Other chest pain: Secondary | ICD-10-CM

## 2021-04-03 DIAGNOSIS — M79602 Pain in left arm: Secondary | ICD-10-CM | POA: Diagnosis not present

## 2021-04-03 DIAGNOSIS — Z28310 Unvaccinated for covid-19: Secondary | ICD-10-CM | POA: Insufficient documentation

## 2021-04-03 DIAGNOSIS — J029 Acute pharyngitis, unspecified: Secondary | ICD-10-CM | POA: Diagnosis present

## 2021-04-03 DIAGNOSIS — R000 Tachycardia, unspecified: Secondary | ICD-10-CM | POA: Diagnosis not present

## 2021-04-03 DIAGNOSIS — J45909 Unspecified asthma, uncomplicated: Secondary | ICD-10-CM | POA: Diagnosis not present

## 2021-04-03 DIAGNOSIS — J069 Acute upper respiratory infection, unspecified: Secondary | ICD-10-CM

## 2021-04-03 HISTORY — DX: Unspecified atrial fibrillation: I48.91

## 2021-04-03 LAB — CBC
HCT: 39.3 % (ref 39.0–52.0)
Hemoglobin: 13.4 g/dL (ref 13.0–17.0)
MCH: 31.2 pg (ref 26.0–34.0)
MCHC: 34.1 g/dL (ref 30.0–36.0)
MCV: 91.6 fL (ref 80.0–100.0)
Platelets: 284 10*3/uL (ref 150–400)
RBC: 4.29 MIL/uL (ref 4.22–5.81)
RDW: 12.7 % (ref 11.5–15.5)
WBC: 5.8 10*3/uL (ref 4.0–10.5)
nRBC: 0 % (ref 0.0–0.2)

## 2021-04-03 LAB — BASIC METABOLIC PANEL
Anion gap: 9 (ref 5–15)
BUN: 13 mg/dL (ref 6–20)
CO2: 28 mmol/L (ref 22–32)
Calcium: 9.4 mg/dL (ref 8.9–10.3)
Chloride: 104 mmol/L (ref 98–111)
Creatinine, Ser: 1.37 mg/dL — ABNORMAL HIGH (ref 0.61–1.24)
GFR, Estimated: >60 mL/min (ref >60)
Glucose, Bld: 127 mg/dL — ABNORMAL HIGH (ref 70–99)
Potassium: 4 mmol/L (ref 3.5–5.1)
Sodium: 141 mmol/L (ref 135–145)

## 2021-04-03 LAB — TROPONIN I (HIGH SENSITIVITY)
Troponin I (High Sensitivity): 2 ng/L (ref <18)
Troponin I (High Sensitivity): 2 ng/L (ref <18)

## 2021-04-03 LAB — SARS CORONAVIRUS 2 (TAT 6-24 HRS): SARS Coronavirus 2: POSITIVE — AB

## 2021-04-03 LAB — D-DIMER, QUANTITATIVE: D-Dimer, Quant: 0.28 ug/mL-FEU (ref 0.00–0.50)

## 2021-04-03 MED ORDER — ASPIRIN 81 MG PO CHEW
324.0000 mg | CHEWABLE_TABLET | Freq: Once | ORAL | Status: AC
  Administered 2021-04-03: 324 mg via ORAL
  Filled 2021-04-03: qty 4

## 2021-04-03 MED ORDER — NITROGLYCERIN 0.4 MG SL SUBL: 0.4000 mg | SUBLINGUAL_TABLET | SUBLINGUAL | Status: DC | PRN

## 2021-04-03 MED ORDER — ACETAMINOPHEN 500 MG PO TABS
1000.0000 mg | ORAL_TABLET | Freq: Once | ORAL | Status: AC
  Administered 2021-04-03: 1000 mg via ORAL
  Filled 2021-04-03: qty 2

## 2021-04-03 MED ORDER — SODIUM CHLORIDE 0.9 % IV BOLUS
1000.0000 mL | Freq: Once | INTRAVENOUS | Status: AC
  Administered 2021-04-03: 1000 mL via INTRAVENOUS

## 2021-04-03 NOTE — ED Provider Notes (Signed)
[place] DEPT Provider Note   CSN: [id number] Arrival date & time: 04/03/21  0844     History Chief Complaint  Patient presents with   Chest Pain   Dizziness   Headache   Chills    Juan Rogers is a 45 y.o. male with past medical history of asthma, A. fib that presents to the emerge department today for complaints of chest pain that started yesterday.  Patient also complains of left arm pain, cough, sore throat, back pain, fevers, chills.  Patient describes chest pain as a pressure-like sensation in the center of his chest worse when he coughs.  Is not currently having any chest pain.,  No longer having any left arm paresthesias.  Has not taken anything for this. Does not take anything for his a fib.  Was placed on a trial for Xarelto 7 years ago, not anymore.  Patient states that he had an episode of A. fib 7 years ago, was cardioverted and never had A. fib again.  No SOB. Has not been vaccinated against COVID. No risk factors for PE.  Denies any palpitations, back pain, abdominal pain, nausea, vomiting, recent long travel, history of anticoagulation, recent surgery, denies any significant cardiac history.  Patient states that headache developed while he has been here.     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    Home Medications Prior to Admission medications   Medication Sig Start Date End Date Taking? Authorizing Provider  cyclobenzaprine (FLEXERIL) 10 MG tablet Take 1 tablet (10 mg total) by mouth 2 (two) times daily as needed for muscle spasms. 11/02/18   Domenic Moras, PA-C  diclofenac Sodium (VOLTAREN) 1 % GEL Apply 2 g topically 4 (four) times daily. 02/21/20   Caccavale, Sophia, PA-C  HYDROcodone-acetaminophen (NORCO/VICODIN) 5-325 MG tablet SMARTSIG:1-2 Tablet(s) By Mouth 2-3 Times Daily PRN 09/01/20   [provider]  ibuprofen (ADVIL,MOTRIN) 600 MG tablet Take 1 tablet (600 mg total) by mouth every 6 (six) hours as needed. 11/02/18   Domenic Moras, PA-C  ondansetron (ZOFRAN ODT) 4 MG disintegrating tablet Take 1 tablet (4 mg total) by mouth every 8 (eight) hours as needed for nausea or vomiting. 06/23/20   Orvan July, NP  tetracycline (SUMYCIN) 250 MG capsule TAKE 1 CAPSULE(250 MG) BY MOUTH TWICE DAILY BEFORE A MEAL 10/27/19   Jaynee Eagles, PA-C    Allergies    Patient has no known allergies.  Review of Systems   Review of Systems  Constitutional:  Positive for fever. Negative for chills, diaphoresis and fatigue.  HENT:  Positive for  sore throat. Negative for congestion and trouble swallowing.   Eyes:  Negative for pain and visual disturbance.  Respiratory:  Positive for cough. Negative for shortness of breath and wheezing.   Cardiovascular:  Positive for chest pain. Negative for palpitations and leg swelling.  Gastrointestinal:  Negative for abdominal distention, abdominal pain, diarrhea, nausea and vomiting.  Genitourinary:  Negative for difficulty urinating.  Musculoskeletal:  Positive for arthralgias. Negative for back pain, neck pain and neck stiffness.  Skin:  Negative for pallor.  Neurological:  Negative for dizziness, speech difficulty, weakness and headaches.  Psychiatric/Behavioral:  Negative  for confusion.    Physical Exam Updated Vital Signs BP (!) 118/103   Pulse 93   Temp 98.2 F (36.8 C)   Resp 17   Ht '6\' 1"'$  (1.854 m)   Wt 88.5 kg   SpO2 97%   BMI 25.73 kg/m   Physical Exam Constitutional:      General: He is not in acute distress.    Appearance: Normal appearance. He is not ill-appearing, toxic-appearing or diaphoretic.  HENT:     Mouth/Throat:     Mouth: Mucous membranes are moist.     Pharynx: Oropharynx is clear.  Eyes:     General: No scleral icterus.    Extraocular Movements: Extraocular movements intact.     Pupils: Pupils are equal, round, and reactive to light.  Cardiovascular:     Rate and Rhythm: Regular rhythm. Tachycardia present.     Pulses: Normal pulses.     Heart sounds: Normal heart sounds.  Pulmonary:     Effort: Pulmonary effort is normal. No respiratory distress.     Breath sounds: Normal breath sounds. No stridor. No wheezing, rhonchi or rales.  Chest:     Chest wall: Tenderness present.  Abdominal:     General: Abdomen is flat. There is no distension.     Palpations: Abdomen is soft.     Tenderness: There is no abdominal tenderness. There is no guarding or rebound.  Musculoskeletal:        General: No swelling or tenderness. Normal range of motion.     Cervical back: Normal range of motion and neck supple. No rigidity.     Right lower leg: No edema.     Left lower leg: No edema.  Skin:    General: Skin is warm and dry.     Capillary Refill: Capillary refill takes less than 2 seconds.     Coloration: Skin is not pale.  Neurological:     General: No focal deficit present.     Mental Status: He is alert and oriented to person, place, and time.  Psychiatric:        Mood and Affect: Mood normal.        Behavior: Behavior normal.    ED Results / Procedures / Treatments   Labs (all labs ordered are listed, but only abnormal results are displayed) Labs Reviewed  BASIC METABOLIC PANEL - Abnormal; Notable for the following  components:      Result Value   Glucose, Bld 127 (*)    Creatinine, Ser 1.37 (*)    All other components within normal limits  SARS CORONAVIRUS 2 (TAT 6-24 HRS)  CBC  D-DIMER, QUANTITATIVE  TROPONIN I (HIGH SENSITIVITY)  TROPONIN I (HIGH SENSITIVITY)    EKG EKG Interpretation  Date/Time:  Friday April 03 2021 08:59:50 EDT Ventricular Rate:  116 PR Interval:  127 QRS Duration: 74 QT Interval:  298 QTC Calculation: 414 R  Axis:   68 Text Interpretation: Sinus tachycardia Borderline T wave abnormalities No significant change since prior 2/20 Confirmed by Aletta Edouard [phone]) on 04/03/2021 10:03:10 AM  Radiology DG Chest 2 View  Result Date: 04/03/2021 CLINICAL DATA:  Chest pain. EXAM: CHEST - 2 VIEW COMPARISON:  Two-view chest x-ray 11/02/2018 FINDINGS: The heart size and mediastinal contours are within normal limits. Both lungs are clear. The visualized skeletal structures are unremarkable. IMPRESSION: Negative two view chest x-ray Electronically Signed   By: San Morelle M.D.   On: 04/03/2021 10:10    Procedures Procedures   Medications Ordered in ED Medications  nitroGLYCERIN (NITROSTAT) SL tablet 0.4 mg (has no administration in time range)  aspirin chewable tablet 324 mg (324 mg Oral Given 04/03/21 1450)  sodium chloride 0.9 % bolus 1,000 mL (0 mLs Intravenous Stopped 04/03/21 1716)  acetaminophen (TYLENOL) tablet 1,000 mg (1,000 mg Oral Given 04/03/21 1542)  sodium chloride 0.9 % bolus 1,000 mL (1,000 mLs Intravenous New Bag/Given 04/03/21 1739)    ED Course  I have reviewed the triage vital signs and the nursing notes.  Pertinent labs & imaging results that were available during my care of the patient were reviewed by me and considered in my medical decision making (see chart for details).    MDM Rules/Calculators/A&P                          Juan Rogers is a 45 y.o. male with past medical history of asthma, A. fib that presents to the emerge department  today for complaints of chest pain and URI symptoms.  Differentials include COVID.  Patient is tachycardic, most likely from dehydration and viral syndrome, unable to [facility] out due to this however low risk for PE.  Will hydrate and obtain COVID testing at this time.  Patient appears very well, nontoxic-appearing, HEAR score 1.  Chest pain is reproducible on exam, appears musculoskeletal.  Upon evaluation, patient is still slightly tachycardic to 105, will obtain D-dimer as well.  Unfortunately done was 624-hour.  D-dimer negative, 2 flat negative troponins, chest x-ray interpreted without any signs of acute cardiopulmonary disease.  COVID test still pending.  I discussed with patient that this is most likely COVID or viral URI, patient is agreeable.  Patient's tachycardia has resolved, now pulse 93.  Patient states that all the symptoms are resolved and he is completely symptom-free and is asking to be discharged at this time.  Patient will follow up in the next 2 days, states that he will go to urgent care for followup.  Patient to be discharged. Doubt need for further emergent work up at this time. I explained the diagnosis and have given explicit precautions to return to the ER including for any other new or worsening symptoms. The patient understands and accepts the medical plan as it's been dictated and I have answered their questions. Discharge instructions concerning home care and prescriptions have been given. The patient is STABLE and is discharged to home in good condition.  I discussed this case with my attending physician who cosigned this note including patient's presenting symptoms, physical exam, and planned diagnostics and interventions. Attending physician stated agreement with plan or made changes to plan which were implemented.   Attending physician assessed patient at bedside.  Final Clinical Impression(s) / ED Diagnoses Final diagnoses:  Viral URI with cough  Chest wall pain     Rx / DC Orders ED Discharge Orders  None        Alfredia Client, PA-C 04/03/21 1829    Quintella Reichert, MD 04/03/21 [phone]

## 2021-04-03 NOTE — Discharge Instructions (Signed)
  You were evaluated in the Emergency Department and after careful evaluation, we did not find any emergent condition requiring admission or further testing in the hospital.   Your exam/testing today was overall reassuring.  Symptoms seem to be due to a viral illness, your COVID test is pending you will see this on MyChart in the next 6 to 24 hours.  If it is positive please refrain from going to work for the next 5 days and follow CDC guidelines.  Make sure to wear a mask for the next 10 days.  Take Tylenol as directed on the bottle and stay hydrated.  Please follow-up with your primary care doctor in the next couple of days if your symptoms or not improving.  If you have any new or worsening concerning symptom please go to the ER. Please return to the Emergency Department if you experience any worsening of your condition.  Thank you for allowing [place] to be a part of your care. Please speak to your pharmacist about any new medications prescribed today in regards to side effects or interactions with other medications.

## 2021-04-03 NOTE — ED Provider Notes (Signed)
Emergency Medicine Provider Triage Evaluation Note  LERON LOSIER , a 45 y.o. male  was evaluated in triage.  Pt complains of cp.  Review of Systems  Positive: Cp, L arm pain, headache, chills Negative: Fever, cough, sore throat, back pain  Physical Exam  BP 131/84 (BP Location: Left Arm)   Pulse (!) 114   Temp 98.8 F (37.1 C) (Oral)   Resp 17   Ht '6\' 1"'$  (1.854 m)   Wt 88.5 kg   SpO2 98%   BMI 25.73 kg/m  Gen:   Awake, appears uncomfortable Resp:  Normal effort, tachycardic MSK:   Moves extremities without difficulty  Other:    Medical Decision Making  Medically screening exam initiated at 10:26 AM.  Appropriate orders placed.  HANSON PASIERB was informed that the remainder of the evaluation will be completed by another provider, this initial triage assessment does not replace that evaluation, and the importance of remaining in the ED until their evaluation is complete.  Patient report having pain to his chest radiates to his left arm with some associated headache and some chills since this morning.  Has been persistent without any specific treatment tried.  Denies any significant cardiac history.   Domenic Moras, PA-C 04/03/21 1028    Hayden Rasmussen, MD 04/03/21 1910

## 2021-04-03 NOTE — ED Triage Notes (Signed)
Patient reports chest pain that radiates into the left arm since 0300 today. Patient states he had SOB when chest pain started. Patient also c/o headache and chills, and sore throat that started this AM.

## 2021-04-11 ENCOUNTER — Ambulatory Visit (HOSPITAL_COMMUNITY): Payer: Self-pay

## 2022-05-27 IMAGING — CR DG CHEST 2V
2 series · 2 of 2 positions shown · non-contrast
Comparison: Two-view chest x-ray 11/02/2018

CLINICAL DATA: Chest pain.

EXAM:
CHEST - 2 VIEW

[w chest pa]
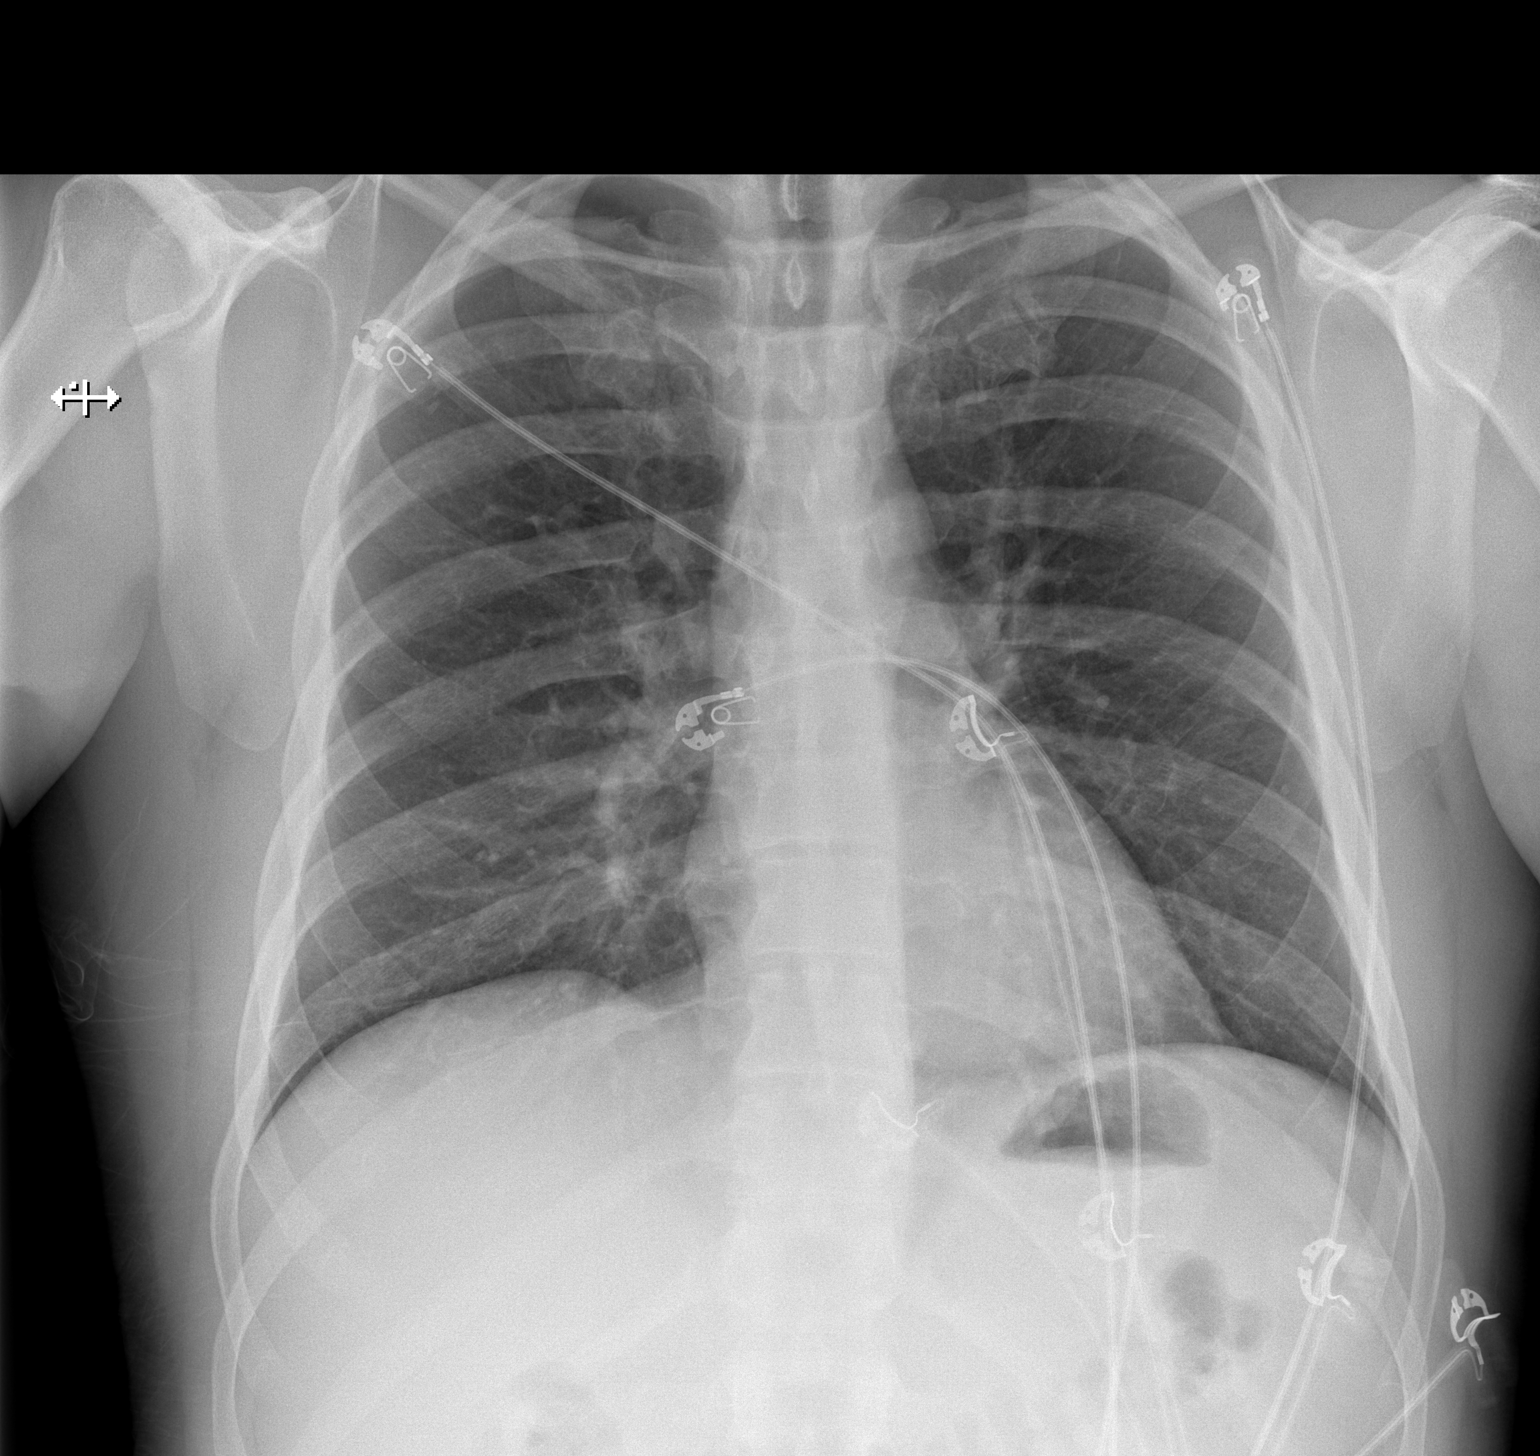

[w chest lat]
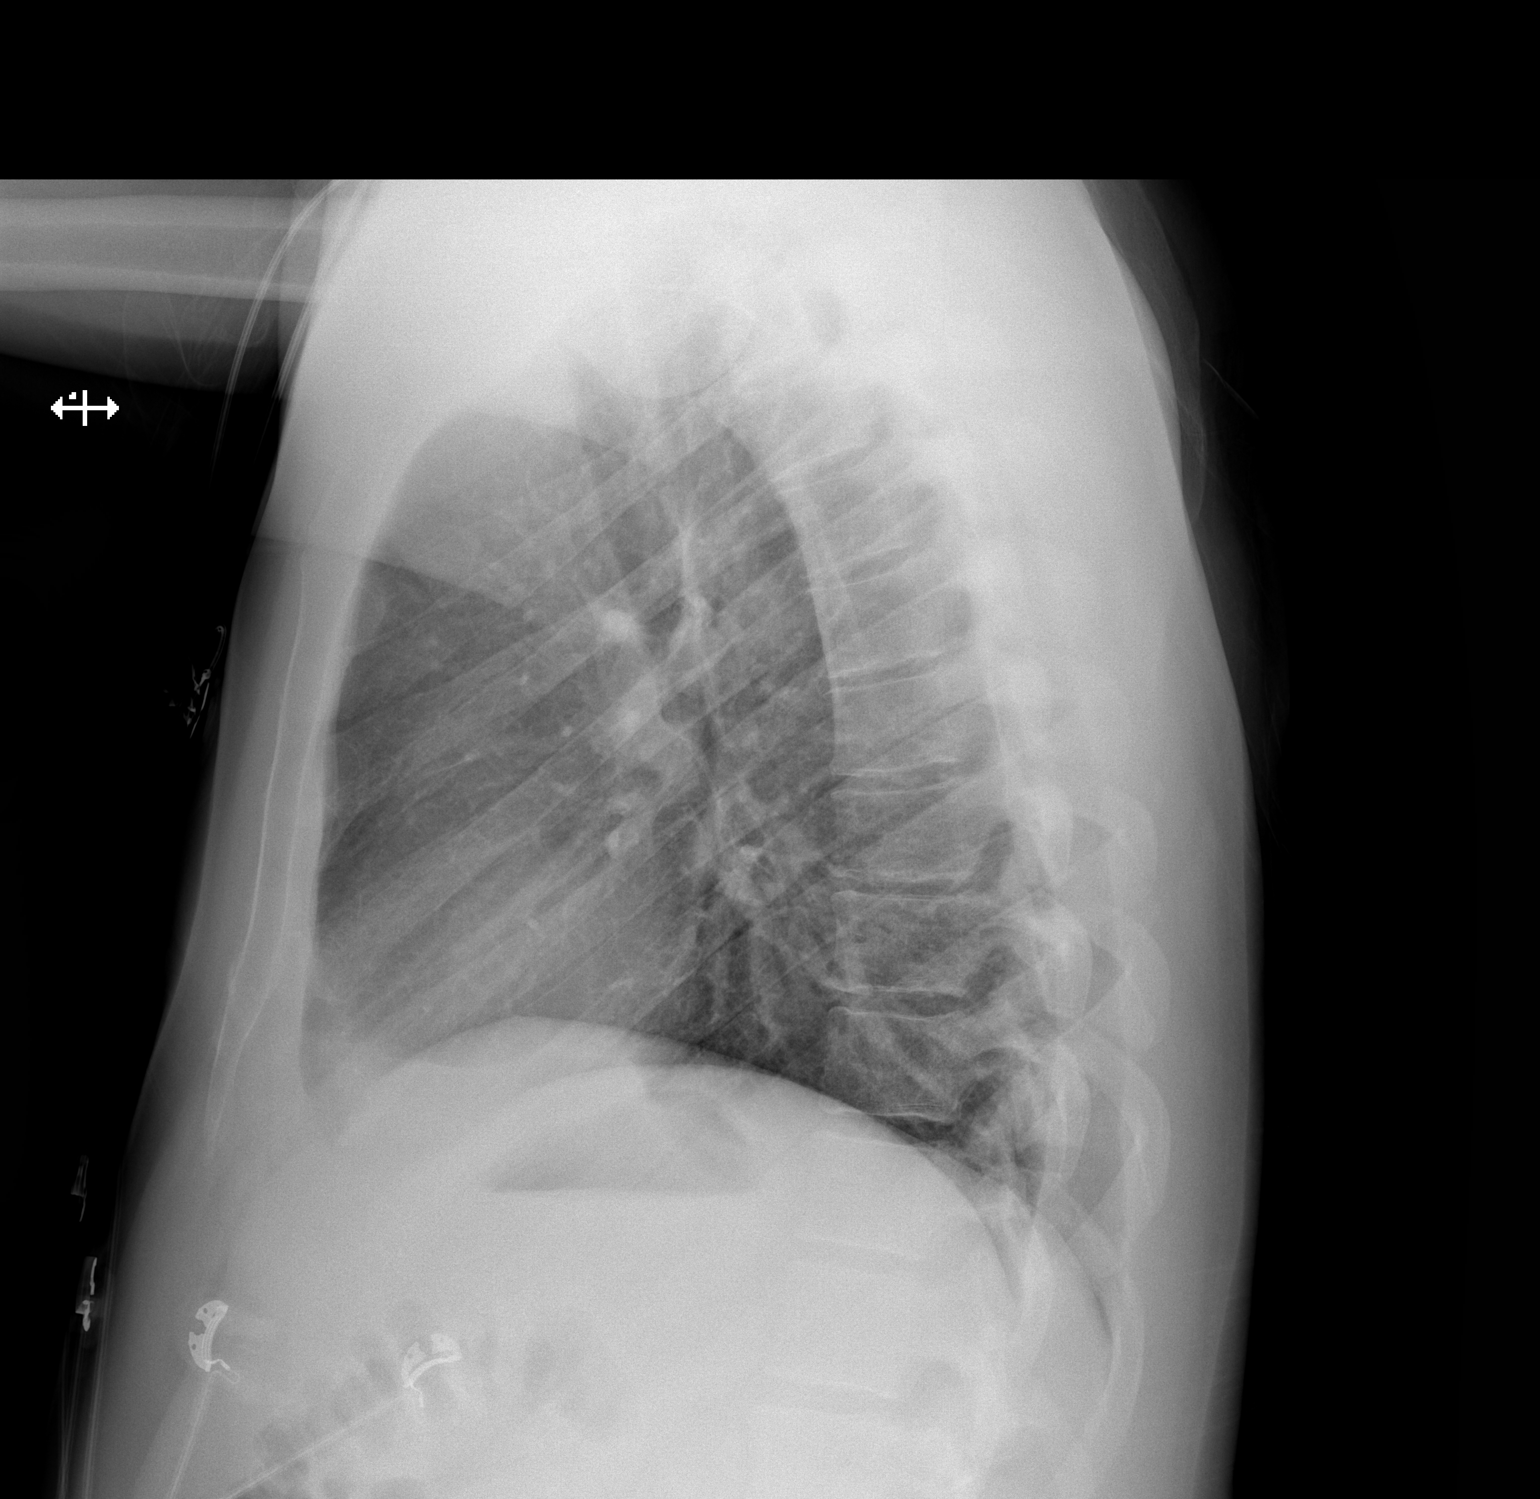

[2 of 2 positions shown; findings below may reference images not displayed]

FINDINGS: The heart size and mediastinal contours are within normal limits.
Both lungs are clear. The visualized skeletal structures are
unremarkable.
IMPRESSION: Negative two view chest x-ray

## 2023-01-18 ENCOUNTER — Encounter (HOSPITAL_COMMUNITY): Payer: Self-pay

## 2023-01-18 ENCOUNTER — Ambulatory Visit (HOSPITAL_COMMUNITY)
Admission: RE | Admit: 2023-01-18 | Discharge: 2023-01-18 | Disposition: A | Discharge status: Home / Self Care | Payer: Self-pay | Source: Ambulatory Visit | Attending: Family Medicine | Admitting: Family Medicine

## 2023-01-18 VITALS — BP 124/83 | HR 95 | Temp 97.9°F | Resp 17

## 2023-01-18 DIAGNOSIS — R300 Dysuria: Secondary | ICD-10-CM | POA: Insufficient documentation

## 2023-01-18 DIAGNOSIS — R109 Unspecified abdominal pain: Secondary | ICD-10-CM | POA: Insufficient documentation

## 2023-01-18 LAB — POCT URINALYSIS DIP (MANUAL ENTRY)
Bilirubin, UA: NEGATIVE
Glucose, UA: NEGATIVE mg/dL
Leukocytes, UA: NEGATIVE
Nitrite, UA: NEGATIVE
Protein Ur, POC: NEGATIVE mg/dL
Spec Grav, UA: 1.025 (ref 1.010–1.025)
Urobilinogen, UA: 0.2 E.U./dL
pH, UA: 6 (ref 5.0–8.0)

## 2023-01-18 MED ORDER — TRAMADOL HCL 50 MG PO TABS: 50.0000 mg | ORAL_TABLET | Freq: Four times a day (QID) | ORAL | 0 refills | Status: AC | PRN

## 2023-01-18 MED ORDER — IBUPROFEN 800 MG PO TABS: 800.0000 mg | ORAL_TABLET | Freq: Three times a day (TID) | ORAL | 0 refills | Status: AC | PRN

## 2023-01-18 MED ORDER — TAMSULOSIN HCL 0.4 MG PO CAPS: 0.4000 mg | ORAL_CAPSULE | Freq: Every day | ORAL | 0 refills | Status: DC

## 2023-01-18 NOTE — Discharge Instructions (Addendum)
The urinalysis has some blood on it.  This could mean you have a kidney stone  Take tramadol 50 mg-- 1 tablet every 6 hours as needed for pain.  This medication can make you sleepy or dizzy  Take ibuprofen 800 mg--1 tab every 8 hours as needed for pain.  Tamsulosin 0.4 mg--1 daily.  This is to help urinary flow  Staff will notify you if there is anything positive on the swab.  Also they are sending the urine for culture and if that needs an antibiotic they will let she know that.

## 2023-01-18 NOTE — ED Provider Notes (Signed)
[facility]    CSN: [id number] Arrival date & time: 01/18/23  1318      History   Chief Complaint Chief Complaint  Patient presents with   Flank Pain    HPI Juan Rogers is a 47 y.o. male.    Flank Pain   Here for pain in his left side for about 4 days.  He began having a little dysuria about the same time.  No blood in his urine and no fever or chills.  No rash.  No cough or congestion  Last EGFR was normal when done  Past Medical History:  Diagnosis Date   Acne    Asthma    Atrial fibrillation [facility])     Patient Active Problem List   Diagnosis Date Noted   Viral gastroenteritis 04/03/2018   Acute bilateral low back pain 12/12/2017   Acne vulgaris 07/19/2017   Biceps tendinopathy, right 01/19/2017   Nevus 10/19/2012   Inflammatory acne 11/10/2006    Past Surgical History:  Procedure Laterality Date   CARDIOVERSION     FOOT SURGERY Right        Home Medications    Prior to Admission medications   Medication Sig Start Date End Date Taking? Authorizing Provider  ibuprofen (ADVIL) 800 MG tablet Take 1 tablet (800 mg total) by mouth every 8 (eight) hours as needed (pain). 01/18/23  Yes Zenia Resides, MD  tamsulosin (FLOMAX) 0.4 MG CAPS capsule Take 1 capsule (0.4 mg total) by mouth daily. 01/18/23  Yes Shareef Eddinger, Janace Aris, MD  traMADol (ULTRAM) 50 MG tablet Take 1 tablet (50 mg total) by mouth every 6 (six) hours as needed (pain). 01/18/23  Yes Zenia Resides, MD  diclofenac Sodium (VOLTAREN) 1 % GEL Apply 2 g topically 4 (four) times daily. 02/21/20   Caccavale, Sophia, PA-C    Family History Family History  Problem Relation Age of Onset   Asthma Mother     Social History Social History   Tobacco Use   Smoking status: Never   Smokeless tobacco: Never  Vaping Use   Vaping Use: Never used  Substance Use Topics   Alcohol use: No   Drug use: No    Comment: Denies recreational drug use.     Allergies   Patient has no known  allergies.   Review of Systems Review of Systems  Genitourinary:  Positive for flank pain.     Physical Exam Triage Vital Signs ED Triage Vitals  Enc Vitals Group     BP 01/18/23 1335 124/83     Pulse Rate 01/18/23 1335 95     Resp 01/18/23 1335 17     Temp 01/18/23 1335 97.9 F (36.6 C)     Temp Source 01/18/23 1335 Oral     SpO2 01/18/23 1335 95 %     Weight --      Height --      Head Circumference --      Peak Flow --      Pain Score 01/18/23 1334 4     Pain Loc --      Pain Edu? --      Excl. in GC? --    No data found.  Updated Vital Signs BP 124/83 (BP Location: Left Arm)   Pulse 95   Temp 97.9 F (36.6 C) (Oral)   Resp 17   SpO2 95%   Visual Acuity Right Eye Distance:   Left Eye Distance:   Bilateral Distance:  Right Eye Near:   Left Eye Near:    Bilateral Near:     Physical Exam Vitals reviewed.  Constitutional:      General: He is not in acute distress.    Appearance: He is not ill-appearing, toxic-appearing or diaphoretic.  HENT:     Mouth/Throat:     Mouth: Mucous membranes are moist.     Pharynx: No oropharyngeal exudate or posterior oropharyngeal erythema.  Eyes:     Extraocular Movements: Extraocular movements intact.     Conjunctiva/sclera: Conjunctivae normal.     Pupils: Pupils are equal, round, and reactive to light.  Cardiovascular:     Rate and Rhythm: Normal rate and regular rhythm.     Heart sounds: No murmur heard. Pulmonary:     Effort: Pulmonary effort is normal.     Breath sounds: Normal breath sounds.  Chest:     Chest wall: Tenderness (left mid axillary line about rib 10-12) present.  Abdominal:     Palpations: Abdomen is soft.     Tenderness: There is no abdominal tenderness. There is no right CVA tenderness or left CVA tenderness.  Musculoskeletal:     Cervical back: Neck supple.  Lymphadenopathy:     Cervical: No cervical adenopathy.  Skin:    Coloration: Skin is not pale.  Neurological:     General: No  focal deficit present.     Mental Status: He is alert and oriented to person, place, and time.  Psychiatric:        Behavior: Behavior normal.      UC Treatments / Results  Labs (all labs ordered are listed, but only abnormal results are displayed) Labs Reviewed  POCT URINALYSIS DIP (MANUAL ENTRY) - Abnormal; Notable for the following components:      Result Value   Ketones, POC UA trace (5) (*)    Blood, UA moderate (*)    All other components within normal limits  URINE CULTURE  CYTOLOGY, (ORAL, ANAL, URETHRAL) ANCILLARY ONLY    EKG   Radiology No results found.  Procedures Procedures (including critical care time)  Medications Ordered in UC Medications - No data to display  Initial Impression / Assessment and Plan / UC Course  I have reviewed the triage vital signs and the nursing notes.  Pertinent labs & imaging results that were available during my care of the patient were reviewed by me and considered in my medical decision making (see chart for details).        Urinalysis shows large amount of blood and a trace of ketones.  I am going to treat for possible renal stone with tamsulosin and tramadol.  Urine culture is sent and if positive staff will treat per protocol  Also staff will notify him of any positives on the urethral self swab that was done Final Clinical Impressions(s) / UC Diagnoses   Final diagnoses:  Left flank pain  Dysuria     Discharge Instructions      The urinalysis has some blood on it.  This could mean you have a kidney stone  Take tramadol 50 mg-- 1 tablet every 6 hours as needed for pain.  This medication can make you sleepy or dizzy  Take ibuprofen 800 mg--1 tab every 8 hours as needed for pain.  Tamsulosin 0.4 mg--1 daily.  This is to help urinary flow  Staff will notify you if there is anything positive on the swab.  Also they are sending the urine for culture and if that  needs an antibiotic they will let she know  that.      ED Prescriptions     Medication Sig Dispense Auth. Provider   ibuprofen (ADVIL) 800 MG tablet Take 1 tablet (800 mg total) by mouth every 8 (eight) hours as needed (pain). 21 tablet Darielys Giglia, Janace Aris, MD   tamsulosin (FLOMAX) 0.4 MG CAPS capsule Take 1 capsule (0.4 mg total) by mouth daily. 30 capsule Zenia Resides, MD   traMADol (ULTRAM) 50 MG tablet Take 1 tablet (50 mg total) by mouth every 6 (six) hours as needed (pain). 12 tablet Zendayah Hardgrave, Janace Aris, MD      I have reviewed the PDMP during this encounter.   Zenia Resides, MD 01/18/23 1425

## 2023-01-18 NOTE — ED Triage Notes (Signed)
Pt reports left side/flank pain x 4 days. Denies any urinary symptoms. Denies n/v/d. Took 2,000 mg of Tylenol today at work.

## 2023-01-19 LAB — URINE CULTURE: Culture: NO GROWTH

## 2023-01-20 LAB — CYTOLOGY, (ORAL, ANAL, URETHRAL) ANCILLARY ONLY
Chlamydia: NEGATIVE
Comment: NEGATIVE
Comment: NEGATIVE
Comment: NORMAL
Neisseria Gonorrhea: NEGATIVE
Trichomonas: NEGATIVE

## 2023-01-22 ENCOUNTER — Ambulatory Visit (HOSPITAL_COMMUNITY)
Admission: RE | Admit: 2023-01-22 | Discharge: 2023-01-22 | Disposition: A | Discharge status: Other Acute Inpatient Hospital | Payer: Self-pay | Source: Ambulatory Visit

## 2023-01-22 ENCOUNTER — Ambulatory Visit (HOSPITAL_COMMUNITY): Payer: Self-pay

## 2023-01-22 DIAGNOSIS — R109 Unspecified abdominal pain: Secondary | ICD-10-CM

## 2023-01-22 NOTE — ED Provider Notes (Signed)
Patient presents for evaluation of persisting left flank pain for 7 days and a pins and needle sensation present to the tip of the penis with dysuria.  Was evaluated in this urgent care 5 days prior, urinalysis and culture negative, STIs negative, was started prophylactically on treatment for possible kidney stone with tamsulosin, endorses taking medication as prescribed.  Endorses no improvement in symptoms but denies worsening.  Denies hematuria, nausea or vomiting.  As flank pain has persisted for 7 days, patient has been advised to go to the nearest emergency department for further evaluation and management as we have reached limitations on diagnostics here in the urgent care, verbalized understanding, to escort self to [facility] as this is his preferred hospital   Valinda Hoar, NP 01/22/23 1423

## 2023-01-22 NOTE — ED Triage Notes (Signed)
A White at Pt bed side to assess Pt.

## 2024-08-20 ENCOUNTER — Encounter (HOSPITAL_BASED_OUTPATIENT_CLINIC_OR_DEPARTMENT_OTHER): Payer: Self-pay | Admitting: Otolaryngology

## 2024-08-20 ENCOUNTER — Other Ambulatory Visit: Payer: Self-pay

## 2024-08-22 ENCOUNTER — Encounter (HOSPITAL_BASED_OUTPATIENT_CLINIC_OR_DEPARTMENT_OTHER)
Admission: RE | Admit: 2024-08-22 | Discharge: 2024-08-22 | Disposition: A | Discharge status: Home / Self Care | Payer: Self-pay | Source: Ambulatory Visit | Attending: Otolaryngology | Admitting: Otolaryngology

## 2024-08-22 DIAGNOSIS — L049 Acute lymphadenitis, unspecified: Secondary | ICD-10-CM | POA: Diagnosis not present

## 2024-08-22 DIAGNOSIS — R221 Localized swelling, mass and lump, neck: Secondary | ICD-10-CM | POA: Insufficient documentation

## 2024-08-22 DIAGNOSIS — I4891 Unspecified atrial fibrillation: Secondary | ICD-10-CM | POA: Diagnosis not present

## 2024-08-22 DIAGNOSIS — J45909 Unspecified asthma, uncomplicated: Secondary | ICD-10-CM | POA: Diagnosis not present

## 2024-08-22 DIAGNOSIS — M542 Cervicalgia: Secondary | ICD-10-CM | POA: Diagnosis present

## 2024-08-23 NOTE — H&P (Signed)
 Otolaryngology Office Note  HPI:   Juan Rogers is a 48 y.o. male who presents as a new Patient.   Referring Provider: No ref. provider found  Chief complaint: Neck mass.  HPI: He was having some dental work done a couple weeks ago and the dentist noticed something asymmetric in the anterior neck on the left side. He has noticed that sometimes when he yawns something pops out and then it takes a few seconds for it to go back down again. He does not really have any pain or any other symptoms related to this. He does not smoke.  PMH/Meds/All/SocHx/FamHx/ROS:   Medical History[1]  Surgical History[2]  No family history of bleeding disorders, wound healing problems or difficulty with anesthesia.     Current Medications[3]  A complete ROS was performed with pertinent positives/negatives noted in the HPI. The remainder of the ROS are negative.   Physical Exam:   BP 124/87 (BP Location: Left arm, Patient Position: Sitting)  Pulse 99  Temp 97.8 F (36.6 C) (Temporal)  Ht 1.854 m (6' 1)  Wt 96.6 kg (213 lb)  BMI 28.10 kg/m   General: Healthy and alert, in no distress, breathing easily. Normal affect. In a pleasant mood. Head: Normocephalic, atraumatic. No masses, or scars. Eyes: Pupils are equal, and reactive to light. Vision is grossly intact. No spontaneous or gaze nystagmus. Ears: Ear canals are clear. Tympanic membranes are intact, with normal landmarks and the middle ears are clear and healthy. Hearing: Grossly normal. Nose: Nasal cavities are clear with healthy mucosa, no polyps or exudate. Airways are patent. Face: No masses or scars, facial nerve function is symmetric. Oral Cavity: No mucosal abnormalities are noted. Tongue with normal mobility. Dentition appears healthy. Oropharynx: Tonsils are symmetric. There are no mucosal masses identified. Tongue base appears normal and healthy. Larynx/Hypopharynx: deferred Chest: Deferred Neck: No palpable masses, no cervical  adenopathy, no thyroid  nodules or enlargement. The area that corresponds to where he feels the lump is adjacent to the left submandibular gland and the left thyroid /hyoid cartilages. Neuro: Cranial nerves II-XII with normal function. Balance: Normal gate. Other findings: none.  Independent Review of Additional Tests or Records:  none  Procedures:  none  Impression & Plans:  Recently palpated mass in the left neck, I am not sure that there is any pathologic palpable. Recommend CT imaging to make sure.   Return visit. Still having significant pain on and off right of midline. On exam the small suprahyoid midline nodule is still present but not really tender. He is very tender along the lesser horn of the hyoid on the right side. There is no distinct mass palpable there. No other adenopathy. He is scheduled for early November for removal of the lymph node. I will prescribe some Toradol  to add on to his ibuprofen  to see if we can get the pain under better control. During the procedure I will go ahead and dissect out the greater and lesser horn of the hyoid to see if there is any obvious pathology there that can be removed.

## 2024-08-24 NOTE — Telephone Encounter (Signed)
 Good evening, Juan Rogers,   It was a pleasure speaking with you. As per our phone conversation, here is the information for your surgery.   Date: 08/27/2024 Location: [facility] [address] [place], [place] [address] Phone Number: [phone] Time to arrive: 7:15 AM          (If there is a cancellation the facility will give you a new time to arrive) Surgery Start Time: 8:45 AM Instructions: Someone from the [facility] pre-op team will be in contact with you to get a medical history and work-up prior to the surgery date. Nothing to eat or drink after midnight unless otherwise authorized by the pre-op anesthesia team.   Your post-op appointment is on 09/18/2024 at 10:10 PM with Dr. JESUS at [address] [place], [place] [address].   Please call Dr. JESUS assistants  [place] at [phone] if: You have any pharmacy issues, or your pharmacy has changed since the last office visit You need FMLA paperwork filled out or a doctors note You have any medical questions pertaining to your surgery You have any post-surgical concerns or complications If you need to reschedule a post-op appointment   Best Regards,   Avelina Mad Surgical Coordinator   [facility] [place] [address], [address] [place], [place]  [address] Phone [phone]/ Fax [phone]   [facility] is now [facility]

## 2024-08-27 ENCOUNTER — Ambulatory Visit (HOSPITAL_BASED_OUTPATIENT_CLINIC_OR_DEPARTMENT_OTHER)

## 2024-08-27 ENCOUNTER — Encounter (HOSPITAL_BASED_OUTPATIENT_CLINIC_OR_DEPARTMENT_OTHER): Admission: RE | Disposition: A | Payer: Self-pay | Source: Home / Self Care | Attending: Otolaryngology

## 2024-08-27 ENCOUNTER — Ambulatory Visit (HOSPITAL_BASED_OUTPATIENT_CLINIC_OR_DEPARTMENT_OTHER)
Admission: RE | Admit: 2024-08-27 | Discharge: 2024-08-27 | Disposition: A | Discharge status: Home / Self Care | Payer: Self-pay | Attending: Otolaryngology | Admitting: Otolaryngology

## 2024-08-27 ENCOUNTER — Encounter (HOSPITAL_BASED_OUTPATIENT_CLINIC_OR_DEPARTMENT_OTHER): Payer: Self-pay | Admitting: Otolaryngology

## 2024-08-27 DIAGNOSIS — J45909 Unspecified asthma, uncomplicated: Secondary | ICD-10-CM | POA: Insufficient documentation

## 2024-08-27 DIAGNOSIS — Z01818 Encounter for other preprocedural examination: Secondary | ICD-10-CM

## 2024-08-27 DIAGNOSIS — R221 Localized swelling, mass and lump, neck: Secondary | ICD-10-CM | POA: Diagnosis not present

## 2024-08-27 DIAGNOSIS — I4891 Unspecified atrial fibrillation: Secondary | ICD-10-CM | POA: Insufficient documentation

## 2024-08-27 DIAGNOSIS — I881 Chronic lymphadenitis, except mesenteric: Secondary | ICD-10-CM | POA: Diagnosis not present

## 2024-08-27 DIAGNOSIS — L049 Acute lymphadenitis, unspecified: Secondary | ICD-10-CM | POA: Insufficient documentation

## 2024-08-27 HISTORY — PX: LYMPH NODE BIOPSY: SHX201

## 2024-08-27 SURGERY — LYMPH NODE BIOPSY
Anesthesia: General | Site: Neck

## 2024-08-27 MED ORDER — FENTANYL CITRATE (PF) 100 MCG/2ML IJ SOLN
INTRAMUSCULAR | Status: AC
  Filled 2024-08-27: qty 2

## 2024-08-27 MED ORDER — ACETAMINOPHEN 10 MG/ML IV SOLN
INTRAVENOUS | Status: AC
  Filled 2024-08-27: qty 100

## 2024-08-27 MED ORDER — PHENYLEPHRINE HCL (PRESSORS) 10 MG/ML IV SOLN
INTRAVENOUS | Status: DC | PRN
  Administered 2024-08-27 (×2): 80 ug via INTRAVENOUS

## 2024-08-27 MED ORDER — PROPOFOL 10 MG/ML IV BOLUS
INTRAVENOUS | Status: AC
  Filled 2024-08-27: qty 20

## 2024-08-27 MED ORDER — ROCURONIUM BROMIDE 100 MG/10ML IV SOLN
INTRAVENOUS | Status: DC | PRN
  Administered 2024-08-27: 09:00:00 50 mg via INTRAVENOUS

## 2024-08-27 MED ORDER — MIDAZOLAM HCL 5 MG/5ML IJ SOLN
INTRAMUSCULAR | Status: DC | PRN
  Administered 2024-08-27: 09:00:00 2 mg via INTRAVENOUS

## 2024-08-27 MED ORDER — OXYCODONE HCL 5 MG PO TABS
ORAL_TABLET | ORAL | Status: AC
  Filled 2024-08-27: qty 1

## 2024-08-27 MED ORDER — LIDOCAINE 2% (20 MG/ML) 5 ML SYRINGE
INTRAMUSCULAR | Status: AC
  Filled 2024-08-27: qty 5

## 2024-08-27 MED ORDER — PROPOFOL 10 MG/ML IV BOLUS
INTRAVENOUS | Status: DC | PRN
  Administered 2024-08-27: 09:00:00 100 mg via INTRAVENOUS
  Administered 2024-08-27 (×2): 50 mg via INTRAVENOUS

## 2024-08-27 MED ORDER — AMISULPRIDE (ANTIEMETIC) 5 MG/2ML IV SOLN: 10.0000 mg | Freq: Once | INTRAVENOUS | Status: DC | PRN

## 2024-08-27 MED ORDER — MIDAZOLAM HCL 2 MG/2ML IJ SOLN
INTRAMUSCULAR | Status: AC
  Filled 2024-08-27: qty 2

## 2024-08-27 MED ORDER — LIDOCAINE HCL (CARDIAC) PF 100 MG/5ML IV SOSY
PREFILLED_SYRINGE | INTRAVENOUS | Status: DC | PRN
  Administered 2024-08-27: 09:00:00 50 mg via INTRAVENOUS

## 2024-08-27 MED ORDER — ONDANSETRON HCL 4 MG/2ML IJ SOLN
INTRAMUSCULAR | Status: DC | PRN
  Administered 2024-08-27: 09:00:00 4 mg via INTRAVENOUS

## 2024-08-27 MED ORDER — 0.9 % SODIUM CHLORIDE (POUR BTL) OPTIME
TOPICAL | Status: DC | PRN
  Administered 2024-08-27: 10:00:00 200 mL

## 2024-08-27 MED ORDER — ACETAMINOPHEN 10 MG/ML IV SOLN
1000.0000 mg | Freq: Once | INTRAVENOUS | Status: DC | PRN
  Administered 2024-08-27: 10:00:00 1000 mg via INTRAVENOUS

## 2024-08-27 MED ORDER — PHENYLEPHRINE 80 MCG/ML (10ML) SYRINGE FOR IV PUSH (FOR BLOOD PRESSURE SUPPORT)
PREFILLED_SYRINGE | INTRAVENOUS | Status: AC
  Filled 2024-08-27: qty 10

## 2024-08-27 MED ORDER — DEXAMETHASONE SODIUM PHOSPHATE 4 MG/ML IJ SOLN
INTRAMUSCULAR | Status: DC | PRN
  Administered 2024-08-27: 09:00:00 8 mg via INTRAVENOUS

## 2024-08-27 MED ORDER — LIDOCAINE-EPINEPHRINE 1 %-1:100000 IJ SOLN
INTRAMUSCULAR | Status: DC | PRN
  Administered 2024-08-27: 10:00:00 2 mL

## 2024-08-27 MED ORDER — FENTANYL CITRATE (PF) 100 MCG/2ML IJ SOLN
25.0000 ug | INTRAMUSCULAR | Status: DC | PRN
  Administered 2024-08-27 (×2): 50 ug via INTRAVENOUS

## 2024-08-27 MED ORDER — ONDANSETRON HCL 4 MG/2ML IJ SOLN: 4.0000 mg | Freq: Once | INTRAMUSCULAR | Status: DC | PRN

## 2024-08-27 MED ORDER — ONDANSETRON 4 MG PO TBDP: 4.0000 mg | ORAL_TABLET | Freq: Three times a day (TID) | ORAL | 0 refills | Status: AC | PRN

## 2024-08-27 MED ORDER — ROCURONIUM BROMIDE 10 MG/ML (PF) SYRINGE
PREFILLED_SYRINGE | INTRAVENOUS | Status: AC
  Filled 2024-08-27: qty 10

## 2024-08-27 MED ORDER — SUGAMMADEX SODIUM 200 MG/2ML IV SOLN
INTRAVENOUS | Status: DC | PRN
  Administered 2024-08-27: 10:00:00 200 mg via INTRAVENOUS

## 2024-08-27 MED ORDER — METHOCARBAMOL 500 MG PO TABS
1000.0000 mg | ORAL_TABLET | Freq: Once | ORAL | Status: AC
  Administered 2024-08-27: 11:00:00 1000 mg via ORAL
  Filled 2024-08-27: qty 2

## 2024-08-27 MED ORDER — DEXMEDETOMIDINE HCL IN NACL 80 MCG/20ML IV SOLN
INTRAVENOUS | Status: DC | PRN
  Administered 2024-08-27: 09:00:00 8 ug via INTRAVENOUS

## 2024-08-27 MED ORDER — OXYCODONE HCL 5 MG PO TABS
5.0000 mg | ORAL_TABLET | Freq: Once | ORAL | Status: AC | PRN
  Administered 2024-08-27: 11:00:00 5 mg via ORAL

## 2024-08-27 MED ORDER — OXYCODONE HCL 5 MG/5ML PO SOLN: 5.0000 mg | Freq: Once | ORAL | Status: AC | PRN

## 2024-08-27 MED ORDER — FENTANYL CITRATE (PF) 100 MCG/2ML IJ SOLN
INTRAMUSCULAR | Status: DC | PRN
  Administered 2024-08-27: 09:00:00 100 ug via INTRAVENOUS

## 2024-08-27 MED ORDER — LACTATED RINGERS IV SOLN: INTRAVENOUS | Status: DC

## 2024-08-27 MED ORDER — CEPHALEXIN 500 MG PO CAPS: 500.0000 mg | ORAL_CAPSULE | Freq: Three times a day (TID) | ORAL | 0 refills | Status: AC

## 2024-08-27 MED ORDER — HYDROCODONE-ACETAMINOPHEN 5-325 MG PO TABS: 1.0000 | ORAL_TABLET | Freq: Four times a day (QID) | ORAL | 0 refills | Status: AC | PRN

## 2024-08-27 MED ORDER — ONDANSETRON HCL 4 MG/2ML IJ SOLN
INTRAMUSCULAR | Status: AC
  Filled 2024-08-27: qty 2

## 2024-08-27 SURGICAL SUPPLY — 43 items
BAND RUBBER #18 3X1/16 STRL (MISCELLANEOUS) IMPLANT
BENZOIN TINCTURE PRP APPL 2/3 (GAUZE/BANDAGES/DRESSINGS) IMPLANT
BLADE SURG 15 STRL LF DISP TIS (BLADE) ×1 IMPLANT
CANISTER SUCT 1200ML W/VALVE (MISCELLANEOUS) ×1 IMPLANT
CLEANER CAUTERY TIP PAD (MISCELLANEOUS) ×1 IMPLANT
CLIP TI MEDIUM 6 (CLIP) IMPLANT
CLIP TI WIDE RED SMALL 6 (CLIP) IMPLANT
CORD BIPOLAR FORCEPS 12FT (ELECTRODE) IMPLANT
COVER BACK TABLE 60X90IN (DRAPES) ×1 IMPLANT
COVER MAYO STAND STRL (DRAPES) ×1 IMPLANT
DERMABOND ADVANCED .7 DNX12 (GAUZE/BANDAGES/DRESSINGS) IMPLANT
DRAIN 10X20 FULL PER LF SIL ST (DRAIN) IMPLANT
DRAIN PENROSE 12X.25 LTX STRL (MISCELLANEOUS) IMPLANT
DRAPE U-SHAPE 76X120 STRL (DRAPES) ×1 IMPLANT
ELECT COATED BLADE 2.86 ST (ELECTRODE) ×1 IMPLANT
ELECTRODE REM PT RTRN 9FT ADLT (ELECTROSURGICAL) ×1 IMPLANT
EVACUATOR SILICONE 100CC (DRAIN) IMPLANT
FORCEPS BIPOLAR SPETZLER 8 1.0 (NEUROSURGERY SUPPLIES) IMPLANT
GAUZE 4X4 16PLY ~~LOC~~+RFID DBL (SPONGE) IMPLANT
GAUZE SPONGE 2X2 STRL 8-PLY (GAUZE/BANDAGES/DRESSINGS) IMPLANT
GAUZE SPONGE 4X4 12PLY STRL LF (GAUZE/BANDAGES/DRESSINGS) IMPLANT
GLOVE ECLIPSE 7.5 STRL STRAW (GLOVE) ×1 IMPLANT
GOWN STRL REUS W/ TWL LRG LVL3 (GOWN DISPOSABLE) ×1 IMPLANT
GOWN STRL REUS W/ TWL XL LVL3 (GOWN DISPOSABLE) ×1 IMPLANT
NDL PRECISIONGLIDE 27X1.5 (NEEDLE) IMPLANT
PACK BASIN DAY SURGERY FS (CUSTOM PROCEDURE TRAY) ×1 IMPLANT
PENCIL FOOT CONTROL (ELECTRODE) ×1 IMPLANT
SOLN 0.9% NACL POUR BTL 1000ML (IV SOLUTION) IMPLANT
STRIP CLOSURE SKIN 1/2X4 (GAUZE/BANDAGES/DRESSINGS) IMPLANT
SUCTION TUBE FRAZIER 10FR DISP (SUCTIONS) IMPLANT
SUT CHROMIC 3 0 PS 2 (SUTURE) IMPLANT
SUT CHROMIC 4 0 RB 1X27 (SUTURE) IMPLANT
SUT ETHILON 4 0 PS 2 18 (SUTURE) IMPLANT
SUT NYLON ETHILON 5-0 P-3 1X18 (SUTURE) IMPLANT
SUT SILK 3-0 18XBRD TIE BLK (SUTURE) ×1 IMPLANT
SUT SILK 4 0 TIES 17X18 (SUTURE) IMPLANT
SUT VIC AB 4-0 PS2 18 (SUTURE) IMPLANT
SUT VICRYL RAPIDE 4-0 (SUTURE) IMPLANT
SYR BULB EAR ULCER 3OZ GRN STR (SYRINGE) IMPLANT
SYR CONTROL 10ML LL (SYRINGE) ×1 IMPLANT
TOWEL GREEN STERILE FF (TOWEL DISPOSABLE) ×1 IMPLANT
TRAY DSU PREP LF (CUSTOM PROCEDURE TRAY) ×1 IMPLANT
TUBE CONNECTING 20X1/4 (TUBING) ×1 IMPLANT

## 2024-08-27 NOTE — Anesthesia Preprocedure Evaluation (Addendum)
 Anesthesia Evaluation  Patient identified by MRN, date of birth, ID band Patient awake    Reviewed: Allergy & Precautions, NPO status , Patient's Chart, lab work & pertinent test results  History of Anesthesia Complications Negative for: history of anesthetic complications  Airway Mallampati: I  TM Distance: >3 FB Neck ROM: Full    Dental  (+) Poor Dentition, Missing, Chipped, Dental Advisory Given,    Pulmonary asthma    breath sounds clear to auscultation       Cardiovascular + dysrhythmias (s/p Cardioversion; No recurrence) Atrial Fibrillation  Rhythm:Regular Rate:Normal     Neuro/Psych    GI/Hepatic negative GI ROS, Neg liver ROS,,,  Endo/Other    Renal/GU Renal disease     Musculoskeletal   Abdominal   Peds  Hematology   Anesthesia Other Findings 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

## 2024-08-27 NOTE — Interval H&P Note (Signed)
 History and Physical Interval Note:  08/27/2024 8:35 AM  Juan Rogers.  has presented today for surgery, with the diagnosis of Neck pain Acute lymphadenitis.  The various methods of treatment have been discussed with the patient and family. After consideration of risks, benefits and other options for treatment, the patient has consented to  Procedures: LYMPH NODE BIOPSY (N/A) as a surgical intervention.  The patient's history has been reviewed, patient examined, no change in status, stable for surgery.  I have reviewed the patient's chart and labs.  Questions were answered to the patient's satisfaction.     Ida Loader

## 2024-08-27 NOTE — Op Note (Signed)
 OPERATIVE REPORT  DATE OF SURGERY: 08/27/2024  PATIENT:  Juan LOISE Matias Mickey.,  48 y.o. male  PRE-OPERATIVE DIAGNOSIS:  Neck pain Chronic lymphadenitis  POST-OPERATIVE DIAGNOSIS:  Neck pain/chronic lymphadenitis  PROCEDURE:  Procedures: LYMPH NODE BIOPSY, anterior neck  SURGEON:  Ida VEAR Loader, MD  ASSISTANTS: None  ANESTHESIA:   General   EBL: 10 ml  DRAINS: None  LOCAL MEDICATIONS USED: 1% Xylocaine  with epinephrine   SPECIMEN: Anterior neck mass biopsy  COUNTS:  Correct  PROCEDURE DETAILS: The patient was taken to the operating room and placed on the operating table in the supine position. Following induction of general endotracheal anesthesia, the neck was prepped and draped in standard fashion.  A transverse incision was outlined with a marking pen at the midline at the inferior aspect of the beard line.  Local anesthetic was infiltrated.  Electrocautery was used to incise the skin and subcutaneous tissue.  The platysma was divided.  Self-retaining retractor was used.  The lymph node area was identified by palpation.  There were actually 2 lymph nodes 1 and a little bit over towards the right side.  Both were dissected free of surrounding fatty tissue and sent for pathologic evaluation.  The underlying musculature was intact.  There was no connection to the hyoid.  The hyoid was palpated along the center and right side and there were no other abnormalities identified.  The wound was irrigated with saline.  The platysma layer was reapproximated with 3-0 chromic sutures interrupted.  Subcuticular running 3-0 chromic was used for closure and Dermabond on the skin surface.  Patient was awakened extubated and transferred to recovery in stable condition.    PATIENT DISPOSITION:  To PACU, stable

## 2024-08-27 NOTE — Discharge Instructions (Addendum)
 You may shower and use soap and water. Do not use any creams, oils or ointment.  Post Anesthesia Home Care Instructions  Activity: Get plenty of rest for the remainder of the day. A responsible individual must stay with you for 24 hours following the procedure.  For the next 24 hours, DO NOT: -Drive a car -Advertising copywriter -Drink alcoholic beverages -Take any medication unless instructed by your physician -Make any legal decisions or sign important papers.  Meals: Start with liquid foods such as gelatin or soup. Progress to regular foods as tolerated. Avoid greasy, spicy, heavy foods. If nausea and/or vomiting occur, drink only clear liquids until the nausea and/or vomiting subsides. Call your physician if vomiting continues.  Special Instructions/Symptoms: Your throat may feel dry or sore from the anesthesia or the breathing tube placed in your throat during surgery. If this causes discomfort, gargle with warm salt water. The discomfort should disappear within 24 hours.  If you had a scopolamine patch placed behind your ear for the management of post- operative nausea and/or vomiting:  1. The medication in the patch is effective for 72 hours, after which it should be removed.  Wrap patch in a tissue and discard in the trash. Wash hands thoroughly with soap and water. 2. You may remove the patch earlier than 72 hours if you experience unpleasant side effects which may include dry mouth, dizziness or visual disturbances. 3. Avoid touching the patch. Wash your hands with soap and water after contact with the patch.    No Tylenol  before 4pm today.

## 2024-08-27 NOTE — Transfer of Care (Signed)
 Immediate Anesthesia Transfer of Care Note  Patient: Juan Rogers.  Procedure(s) Performed: LYMPH NODE BIOPSY (Neck)  Patient Location: PACU  Anesthesia Type:General  Level of Consciousness: awake, alert , and oriented  Airway & Oxygen Therapy: Patient Spontanous Breathing and Patient connected to face mask oxygen  Post-op Assessment: Report given to RN and Post -op Vital signs reviewed and stable  Post vital signs: Reviewed and stable  Last Vitals:  Vitals Value Taken Time  BP    Temp    Pulse 69 08/27/24 09:49  Resp 10 08/27/24 09:49  SpO2 97 % 08/27/24 09:49  Vitals shown include unfiled device data.  Last Pain:  Vitals:   08/27/24 0739  TempSrc: Temporal  PainSc: 0-No pain      Patients Stated Pain Goal: 3 (08/27/24 0739)  Complications: No notable events documented.

## 2024-08-27 NOTE — Anesthesia Postprocedure Evaluation (Signed)
 Anesthesia Post Note  Patient: Juan Rogers.  Procedure(s) Performed: LYMPH NODE BIOPSY (Neck)     Patient location during evaluation: PACU Anesthesia Type: General Level of consciousness: awake Pain management: pain level controlled Vital Signs Assessment: post-procedure vital signs reviewed and stable Respiratory status: spontaneous breathing and nonlabored ventilation Cardiovascular status: blood pressure returned to baseline Postop Assessment: no apparent nausea or vomiting Anesthetic complications: no   No notable events documented.  Last Vitals:  Vitals:   08/27/24 1030 08/27/24 1049  BP: 96/68 105/72  Pulse: 68 65  Resp: 14 16  Temp:  36.5 C  SpO2: 94% 96%    Last Pain:  Vitals:   08/27/24 1109  TempSrc:   PainSc: 5                  Ollie Esty T Colhoun

## 2024-08-27 NOTE — Anesthesia Procedure Notes (Signed)
 Procedure Name: Intubation Date/Time: 08/27/2024 9:10 AM  Performed by: Buster Catheryn SAUNDERS, CRNAPre-anesthesia Checklist: Patient identified, Emergency Drugs available, Suction available and Patient being monitored Patient Re-evaluated:Patient Re-evaluated prior to induction Oxygen Delivery Method: Circle system utilized Preoxygenation: Pre-oxygenation with 100% oxygen Induction Type: IV induction Ventilation: Mask ventilation without difficulty Laryngoscope Size: Miller and 2 Grade View: Grade II Tube type: Oral Tube size: 7.0 mm Number of attempts: 1 Airway Equipment and Method: Stylet and Oral airway Placement Confirmation: ETT inserted through vocal cords under direct vision, positive ETCO2 and breath sounds checked- equal and bilateral Secured at: 22 cm Tube secured with: Tape Dental Injury: Teeth and Oropharynx as per pre-operative assessment

## 2024-08-28 ENCOUNTER — Encounter (HOSPITAL_BASED_OUTPATIENT_CLINIC_OR_DEPARTMENT_OTHER): Payer: Self-pay | Admitting: Otolaryngology

## 2024-08-28 LAB — SURGICAL PATHOLOGY
